# Patient Record
Sex: Female | Born: 1960 | Race: White | Hispanic: No | Marital: Married | Smoking: Current every day smoker
Health system: Southern US, Community
[De-identification: ages and names within clinical notes are randomized; demographics above are authoritative.]

## PROBLEM LIST (undated history)

## (undated) DIAGNOSIS — I10 Essential (primary) hypertension: Secondary | ICD-10-CM

## (undated) DIAGNOSIS — E7800 Pure hypercholesterolemia, unspecified: Secondary | ICD-10-CM

## (undated) HISTORY — DX: Pure hypercholesterolemia, unspecified: E78.00

## (undated) HISTORY — DX: Essential (primary) hypertension: I10

## (undated) HISTORY — PX: ABDOMINAL HYSTERECTOMY: SHX81

---

## 2012-07-07 ENCOUNTER — Ambulatory Visit: Payer: Self-pay | Admitting: Family Medicine

## 2012-07-25 ENCOUNTER — Ambulatory Visit: Payer: Self-pay | Admitting: Obstetrics & Gynecology

## 2012-07-25 LAB — CBC
HCT: 39 % (ref 35.0–47.0)
MCH: 30.2 pg (ref 26.0–34.0)
MCHC: 35.3 g/dL (ref 32.0–36.0)
RDW: 13.4 % (ref 11.5–14.5)

## 2012-08-03 ENCOUNTER — Inpatient Hospital Stay: Payer: Self-pay | Admitting: Obstetrics & Gynecology

## 2012-08-04 LAB — PATHOLOGY REPORT

## 2012-08-04 LAB — HEMOGLOBIN: HGB: 11.4 g/dL — ABNORMAL LOW (ref 12.0–16.0)

## 2015-04-01 NOTE — Op Note (Signed)
PATIENT NAME:  Rebecca Lara, Rebecca Lara MR#:  [id number] DATE OF BIRTH:  11-Jan-1961  DATE OF PROCEDURE:  08/03/2012  PREOPERATIVE DIAGNOSIS: Fibroid uterus.  POSTOPERATIVE DIAGNOSIS: Fibroid uterus.  PROCEDURE: Total abdominal hysterectomy, bilateral salpingo-oophorectomy.   SURGEON: Dierdre Searles. Paul Harris, MD  ASSISTANT: Adria Devonarrie Klett, MD  ANESTHESIA: General.   ESTIMATED BLOOD LOSS: 150 mL.   COMPLICATIONS: None.   FINDINGS: Large fibroid uterus, small normal appearing ovaries.   DISPOSITION: To recovery in stable condition.   TECHNIQUE: The patient is prepped and draped in the usual sterile fashion after adequate anesthesia is obtained in the supine position on the operating room table with a Foley catheter in place. A scalpel is used to create a midline vertical skin incision from just above the pubic symphysis to 3 cm above the umbilicus. Dissection is carried down to the level of the rectus fascia which is incised and dissected in a superior and inferior fashion in the midline and the rectus abdominous muscles are identified and separated in the midline. Peritoneum is penetrated and a retractor is placed after the uterus is delivered through the incision. The uterus is quite large and thus makes for a tedious and high degree of difficulty hysterectomy.   The round ligaments are carefully clamped, transected, and suture ligated with Vicryl sutures. The infundibulopelvic blood vessels and ligaments are carefully clamped, transected, and suture ligated to completely remove the adnexa along with the uterus. Clamps are placed sequentially down to the level of the uterine arteries. The bladder is dissected across the anterior portion of the lower uterine segment and retracted in an inferior fashion so that it is not involved in the surgery. The uterine arteries are clamped, transected, and suture ligated. At this time, the majority of the uterus is amputated due to its size for better visualization. The  remaining portion of the cervix is then clamped with a double-tooth tenaculum. Clamps are placed along the level of the cervix until the level of the external cervical os is reached. Clamps are placed across the vaginal apex and the cervix is completely amputated. The vaginal cuff is closed with interrupted Vicryl sutures. Irrigation of the pelvic cavity is performed with aspiration of all fluid. Excellent hemostasis is noted. The vaginal cuff is appropriately closed with no gapping. The ureters are palpated on both sides and there is no apparent injury to ureter, bowel, or bladder. The patient is leveled and any packing sponges and retractors are removed. All sponge counts are correct at this point in time. The rectus fascia is closed with a #1 PDS suture. The subcutaneous tissues are irrigated and hemostasis is assured using electrocautery. The umbilicus is plicated back into a normal anatomical position. A subcutaneous layer of plain gut suture is placed followed by surgical clips or staples to the skin. A bandage is then applied. The patient goes to the recovery room in stable condition. All sponge, instrument, and needle counts are correct at the conclusion of the case.  ____________________________ R. Annamarie MajorPaul Harris, MD rph:slb D: 08/03/2012 12:58:59 ET T: 08/03/2012 13:26:46 ET JOB#: [id number]  cc: Dierdre Searles. Paul Harris, MD, <Dictator> Nadara MustardOBERT P HARRIS MD ELECTRONICALLY SIGNED 08/04/2012 9:41

## 2015-09-13 ENCOUNTER — Emergency Department
Admission: EM | Admit: 2015-09-13 | Discharge: 2015-09-13 | Disposition: A | Discharge status: Home / Self Care | Payer: 59 | Attending: Emergency Medicine | Admitting: Emergency Medicine

## 2015-09-13 ENCOUNTER — Emergency Department: Payer: 59

## 2015-09-13 ENCOUNTER — Other Ambulatory Visit: Payer: Self-pay

## 2015-09-13 ENCOUNTER — Encounter: Payer: Self-pay | Admitting: Emergency Medicine

## 2015-09-13 DIAGNOSIS — J209 Acute bronchitis, unspecified: Secondary | ICD-10-CM | POA: Insufficient documentation

## 2015-09-13 DIAGNOSIS — Z720 Tobacco use: Secondary | ICD-10-CM | POA: Diagnosis not present

## 2015-09-13 DIAGNOSIS — Z79899 Other long term (current) drug therapy: Secondary | ICD-10-CM | POA: Diagnosis not present

## 2015-09-13 DIAGNOSIS — R05 Cough: Secondary | ICD-10-CM | POA: Diagnosis present

## 2015-09-13 DIAGNOSIS — R000 Tachycardia, unspecified: Secondary | ICD-10-CM | POA: Insufficient documentation

## 2015-09-13 MED ORDER — PREDNISONE 20 MG PO TABS: 60.0000 mg | ORAL_TABLET | Freq: Every day | ORAL | Status: AC

## 2015-09-13 MED ORDER — ALBUTEROL (5 MG/ML) CONTINUOUS INHALATION SOLN: 5.0000 mg/h | INHALATION_SOLUTION | Freq: Once | RESPIRATORY_TRACT | Status: DC

## 2015-09-13 MED ORDER — ALBUTEROL SULFATE (2.5 MG/3ML) 0.083% IN NEBU
2.5000 mg | INHALATION_SOLUTION | Freq: Once | RESPIRATORY_TRACT | Status: AC
  Administered 2015-09-13: 2.5 mg via RESPIRATORY_TRACT
  Filled 2015-09-13: qty 3

## 2015-09-13 MED ORDER — AZITHROMYCIN 250 MG PO TABS
500.0000 mg | ORAL_TABLET | Freq: Once | ORAL | Status: AC
  Administered 2015-09-13: 500 mg via ORAL
  Filled 2015-09-13: qty 2

## 2015-09-13 MED ORDER — SODIUM CHLORIDE 0.9 % IV BOLUS (SEPSIS)
1000.0000 mL | Freq: Once | INTRAVENOUS | Status: AC
  Administered 2015-09-13: 1000 mL via INTRAVENOUS
  Filled 2015-09-13: qty 1000

## 2015-09-13 MED ORDER — AZITHROMYCIN 250 MG PO TABS: ORAL_TABLET | ORAL | Status: AC

## 2015-09-13 MED ORDER — ALBUTEROL SULFATE HFA 108 (90 BASE) MCG/ACT IN AERS: 2.0000 | INHALATION_SPRAY | Freq: Four times a day (QID) | RESPIRATORY_TRACT | Status: DC | PRN

## 2015-09-13 MED ORDER — IPRATROPIUM-ALBUTEROL 0.5-2.5 (3) MG/3ML IN SOLN
3.0000 mL | Freq: Once | RESPIRATORY_TRACT | Status: AC
  Administered 2015-09-13: 3 mL via RESPIRATORY_TRACT

## 2015-09-13 MED ORDER — PREDNISONE 20 MG PO TABS
60.0000 mg | ORAL_TABLET | Freq: Once | ORAL | Status: AC
  Administered 2015-09-13: 60 mg via ORAL
  Filled 2015-09-13: qty 3

## 2015-09-13 NOTE — ED Notes (Signed)
Pt arrived via EMS. Pt given 1 duoneb en route. Pt states she began having SOB after taking Dayquil for a cold, pt states the cold settled in her chest. Pt states she has been coughing up thin, yellow phlegm since Thursday. Pt states she began getting SOB, called EMS due to not being able to breathe. Increased work of breathing noted at this time, increased RR, pt able to speak in full sentences. Congested cough noted at this time.

## 2015-09-13 NOTE — Discharge Instructions (Signed)

## 2015-09-13 NOTE — ED Provider Notes (Addendum)
[facility] Emergency Department Provider Note  ____________________________________________   I have reviewed the triage vital signs and the nursing notes.   HISTORY  Chief Complaint Cough and Shortness of Breath    HPI Rebecca Lara is a 54 y.o. female with a history of tobacco abuse otherwise healthy states she gets sinusitis once a year. Started with a runny nose and yellowish drainage on Tuesday, on Wednesday she began to cough worse and her cough has persisted. Her URI symptoms have otherwise abated with the cough is persistent. She also began to wheeze. She smokes 8 cigarettes a day. Any fever, she denies any nausea or vomiting or rashes no orthopnea and no leg swelling no chest pain. She does not have a history of recurrent bronchitis she states. For triage, she was wheezy and she continues to wheeze. She did receive nebulizer prior to arrival, she states it is helped her feel much better  History reviewed. No pertinent past medical history.  There are no active problems to display for this patient.   Past Surgical History  Procedure Laterality Date  . Abdominal hysterectomy      Current Outpatient Rx  Name  Route  Sig  Dispense  Refill  . Misc Natural Products (ESTROVEN ENERGY) TABS   Oral   Take by mouth.           Allergies Review of patient's allergies indicates no known allergies.  History reviewed. No pertinent family history. no history of blood clots in her family. No history early heart disease Social History Social History  Substance Use Topics  . Smoking status: Current Every Day Smoker -- 0.50 packs/day    Types: Cigarettes  . Smokeless tobacco: Never Used  . Alcohol Use: No    Review of Systems Constitutional: No fever/chills Eyes: No visual changes. ENT: No sore throat. No stiff neck no neck pain Cardiovascular: Denies chest pain. Respiratory: Denies shortness of breath. Gastrointestinal:   no vomiting.  No  diarrhea.  No constipation. Genitourinary: Negative for dysuria. Musculoskeletal: Negative lower extremity swelling Skin: Negative for rash. Neurological: Negative for headaches, focal weakness or numbness. 10-point ROS otherwise negative.  ____________________________________________   PHYSICAL EXAM:  VITAL SIGNS: ED Triage Vitals  Enc Vitals Group     BP 09/13/15 0710 156/105 mmHg     Pulse Rate 09/13/15 0710 129     Resp 09/13/15 0710 24     Temp 09/13/15 0710 97.8 F (36.6 C)     Temp Source 09/13/15 0710 Oral     SpO2 09/13/15 0710 93 %     Weight 09/13/15 0710 180 lb (81.647 kg)     Height 09/13/15 0710  (1.702 m)     Head Cir --      Peak Flow --      Pain Score --      Pain Loc --      Pain Edu? --      Excl. in GC? --     Constitutional: Alert and oriented. Well appearing and in no acute distress. Walking to the room Eyes: Conjunctivae are normal. PERRL. EOMI. Head: Atraumatic. Nose: No congestion/rhinnorhea. Mouth/Throat: Mucous membranes are moist.  Oropharynx non-erythematous. Neck: No stridor.   Nontender with no meningismus Cardiovascular: Tachycardia noted Grossly normal heart sounds.  Good peripheral circulation. Respiratory: Normal respiratory effort.  No retractions. Patient with continued diffuse wheeze but good air movement no significant accessory muscle use speaking in full sentences no rales or rhonchi Gastrointestinal: Soft  and nontender. No distention. No guarding no rebound Back:  There is no focal tenderness or step off there is no midline tenderness there are no lesions noted. there is no CVA tenderness Musculoskeletal: No lower extremity tenderness. No joint effusions, no DVT signs strong distal pulses no edema Neurologic:  Normal speech and language. No gross focal neurologic deficits are appreciated.  Skin:  Skin is warm, dry and intact. No rash noted. Psychiatric: Mood and affect are normal. Speech and behavior are  normal.  ____________________________________________   LABS (all labs ordered are listed, but only abnormal results are displayed)  Labs Reviewed - No data to display ____________________________________________  EKG  I have reviewed the EKG, sinus tach rate 129, normal axis no acute ST elevation or depression ____________________________________________  RADIOLOGY  I have reviewed radiology results ____________________________________________   PROCEDURES  Procedure(s) performed: None  Critical Care performed: None  ____________________________________________   INITIAL IMPRESSION / ASSESSMENT AND PLAN / ED COURSE  Pertinent labs & imaging results that were available during my care of the patient were reviewed by me and considered in my medical decision making (see chart for details).  Patient with cough and wheeze in the context of tobacco abuse. We will give her coverage for atypical infection given her smoking history although the significant history viral. I have counseled her about smoking cessation. We'll give her IV fluid her heart rate is somewhat up I suspect that is more secondary to the patient's albuterol prior to arrival then acute illness, she certainly does not appear toxic. Nonetheless we'll give her IV fluid. I will give her steroids for her wheeze. Nothing at this time to suggest myocarditis endocarditis pericarditis pulmonary embolus, CAD or pneumothorax, chest x-ray is reviewed and is normal. It is my hope that she will feel better soon and be safe for discharge   ----------------------------------------- 9:19 AM on 09/13/2015 -----------------------------------------   The patient states that this time she feels much better. She feels that her breathing is greatly improved by the neb. She still has wheezing but is moving air better and appears quite comfortable. We will continue to watch her. Heart rate is 110 to 120s mostly at this time which is  reflective of the albuterol. ____________________________________________   ----------------------------------------- 10:30 AM on 09/13/2015 -----------------------------------------   Patient has clear lungs states she feels better than she has 4 days, heart rate is 99 sats are 97 no work of breathing feels 100% better we will discharge her home with steroids and antibiotics because of her smoking history as well as albuterol. Return precautions and follow-up stressed and understood. Respiratory rate at this time is 19.  FINAL CLINICAL IMPRESSION(S) / ED DIAGNOSES  Final diagnoses:  None     Jeanmarie Plant, MD 09/13/15 0454  Jeanmarie Plant, MD 09/13/15 0981  Jeanmarie Plant, MD 09/13/15 1030

## 2015-09-17 ENCOUNTER — Encounter: Payer: Self-pay | Admitting: Family

## 2015-09-17 ENCOUNTER — Ambulatory Visit: Payer: Self-pay | Admitting: Family

## 2015-09-17 VITALS — BP 170/110 | HR 90 | Temp 98.6°F | Resp 16

## 2015-09-17 DIAGNOSIS — R059 Cough, unspecified: Secondary | ICD-10-CM

## 2015-09-17 DIAGNOSIS — R030 Elevated blood-pressure reading, without diagnosis of hypertension: Secondary | ICD-10-CM

## 2015-09-17 DIAGNOSIS — IMO0001 Reserved for inherently not codable concepts without codable children: Secondary | ICD-10-CM

## 2015-09-17 DIAGNOSIS — R05 Cough: Secondary | ICD-10-CM

## 2015-09-17 DIAGNOSIS — J019 Acute sinusitis, unspecified: Secondary | ICD-10-CM

## 2015-09-17 MED ORDER — BENZONATATE 200 MG PO CAPS: 200.0000 mg | ORAL_CAPSULE | Freq: Two times a day (BID) | ORAL | Status: DC | PRN

## 2015-09-17 MED ORDER — LEVOFLOXACIN 500 MG PO TABS: 500.0000 mg | ORAL_TABLET | Freq: Every day | ORAL | Status: DC

## 2015-09-17 NOTE — Progress Notes (Signed)
S/ persistant cough despite rx in ED 4 days ago  . Facial pain and pressure , fever blisters.increased PND  Can not rest   O/ alert , NAD , dry cough , tearful ,stressed  ENT nasal mucosa excoriated + facial tenderness and increased PND  Heart RSR Lungs decreased breath sounds  A/ sinusitis  Cough  Elevated bp - stressed,  Smoked and drank large tea prior to arrival  no hx   P /Rx levaquin 500 mg one daily x 10  Hydration . Decrease caffeine .  Rest at home this evening. F/u with PCP tomorrow for bp recheck .

## 2015-09-18 ENCOUNTER — Telehealth: Payer: Self-pay | Admitting: Family

## 2015-09-18 NOTE — Telephone Encounter (Signed)
Called to follow up on clinical status . States she will get her bp checked at the drug store across the street from her work today. She has no history of bp elevation except in the ED after  Neb tx. Multiple complaints about her visit. Was late being called back .  She was disturbed that when getting her rx filled here  another patient  seen at the same time by another provider got her rx sooner ,seemingly because it was hand written vs e-prescribed. I did have to resend the rx  Electronically as she was using a different pharmacy than was on file.  I expressed understanding of her concerns and that I hoped she would soon feel improvement with her medication.She did express thanks for what we did do for her and during her visit she had expressed numerous times thanks for seeing her .

## 2015-09-23 ENCOUNTER — Ambulatory Visit: Payer: Self-pay | Admitting: Unknown Physician Specialty

## 2016-04-20 IMAGING — CR DG CHEST 2V
1 series · 2 of 2 positions shown · non-contrast
Comparison: None.

CLINICAL DATA: Two day history of cough.  Shortness of breath.

EXAM:
CHEST  2 VIEW

[Series 1: w chest pa · 0.14mm/px · 2 of 2 slices shown]
[im 1/2]
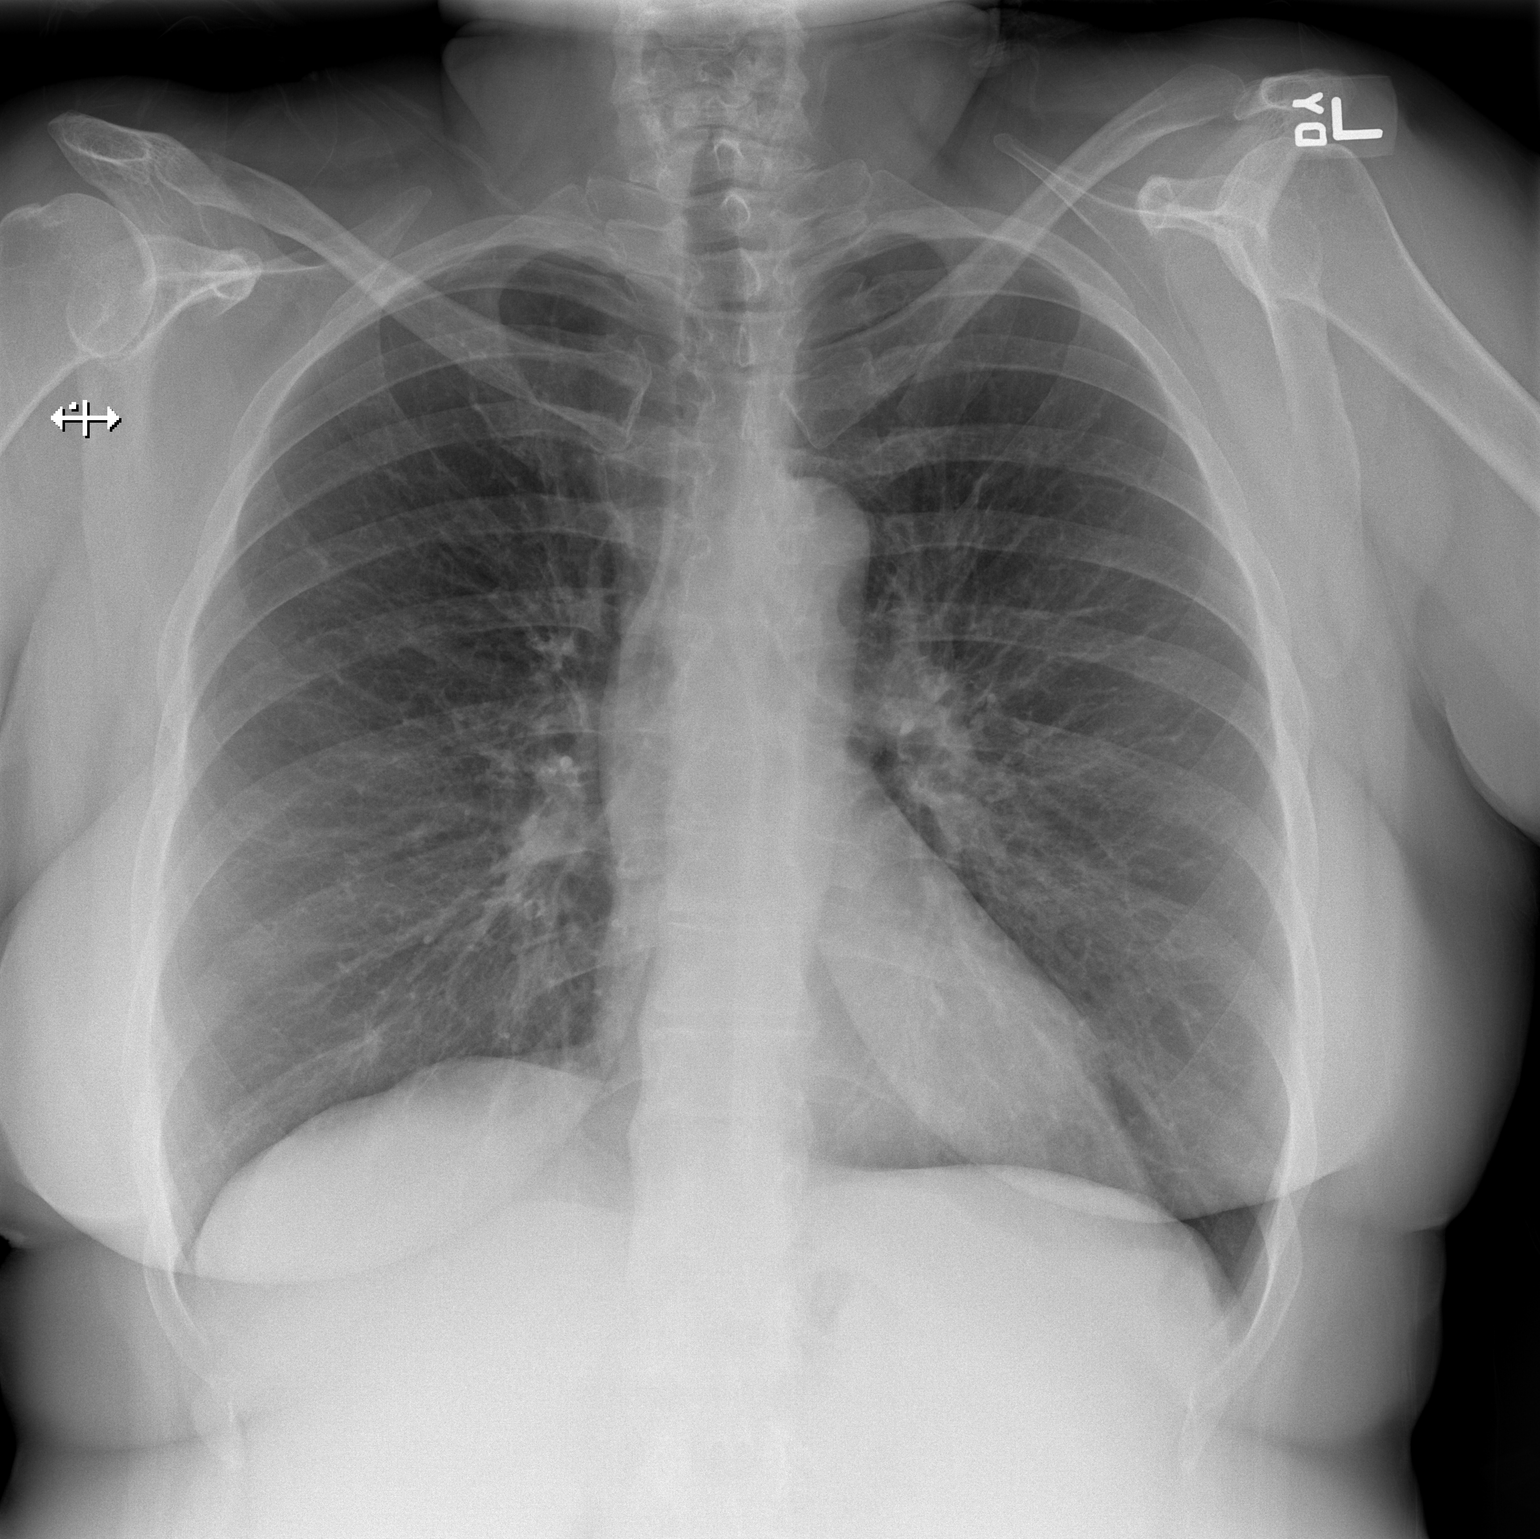
[im 2/2]
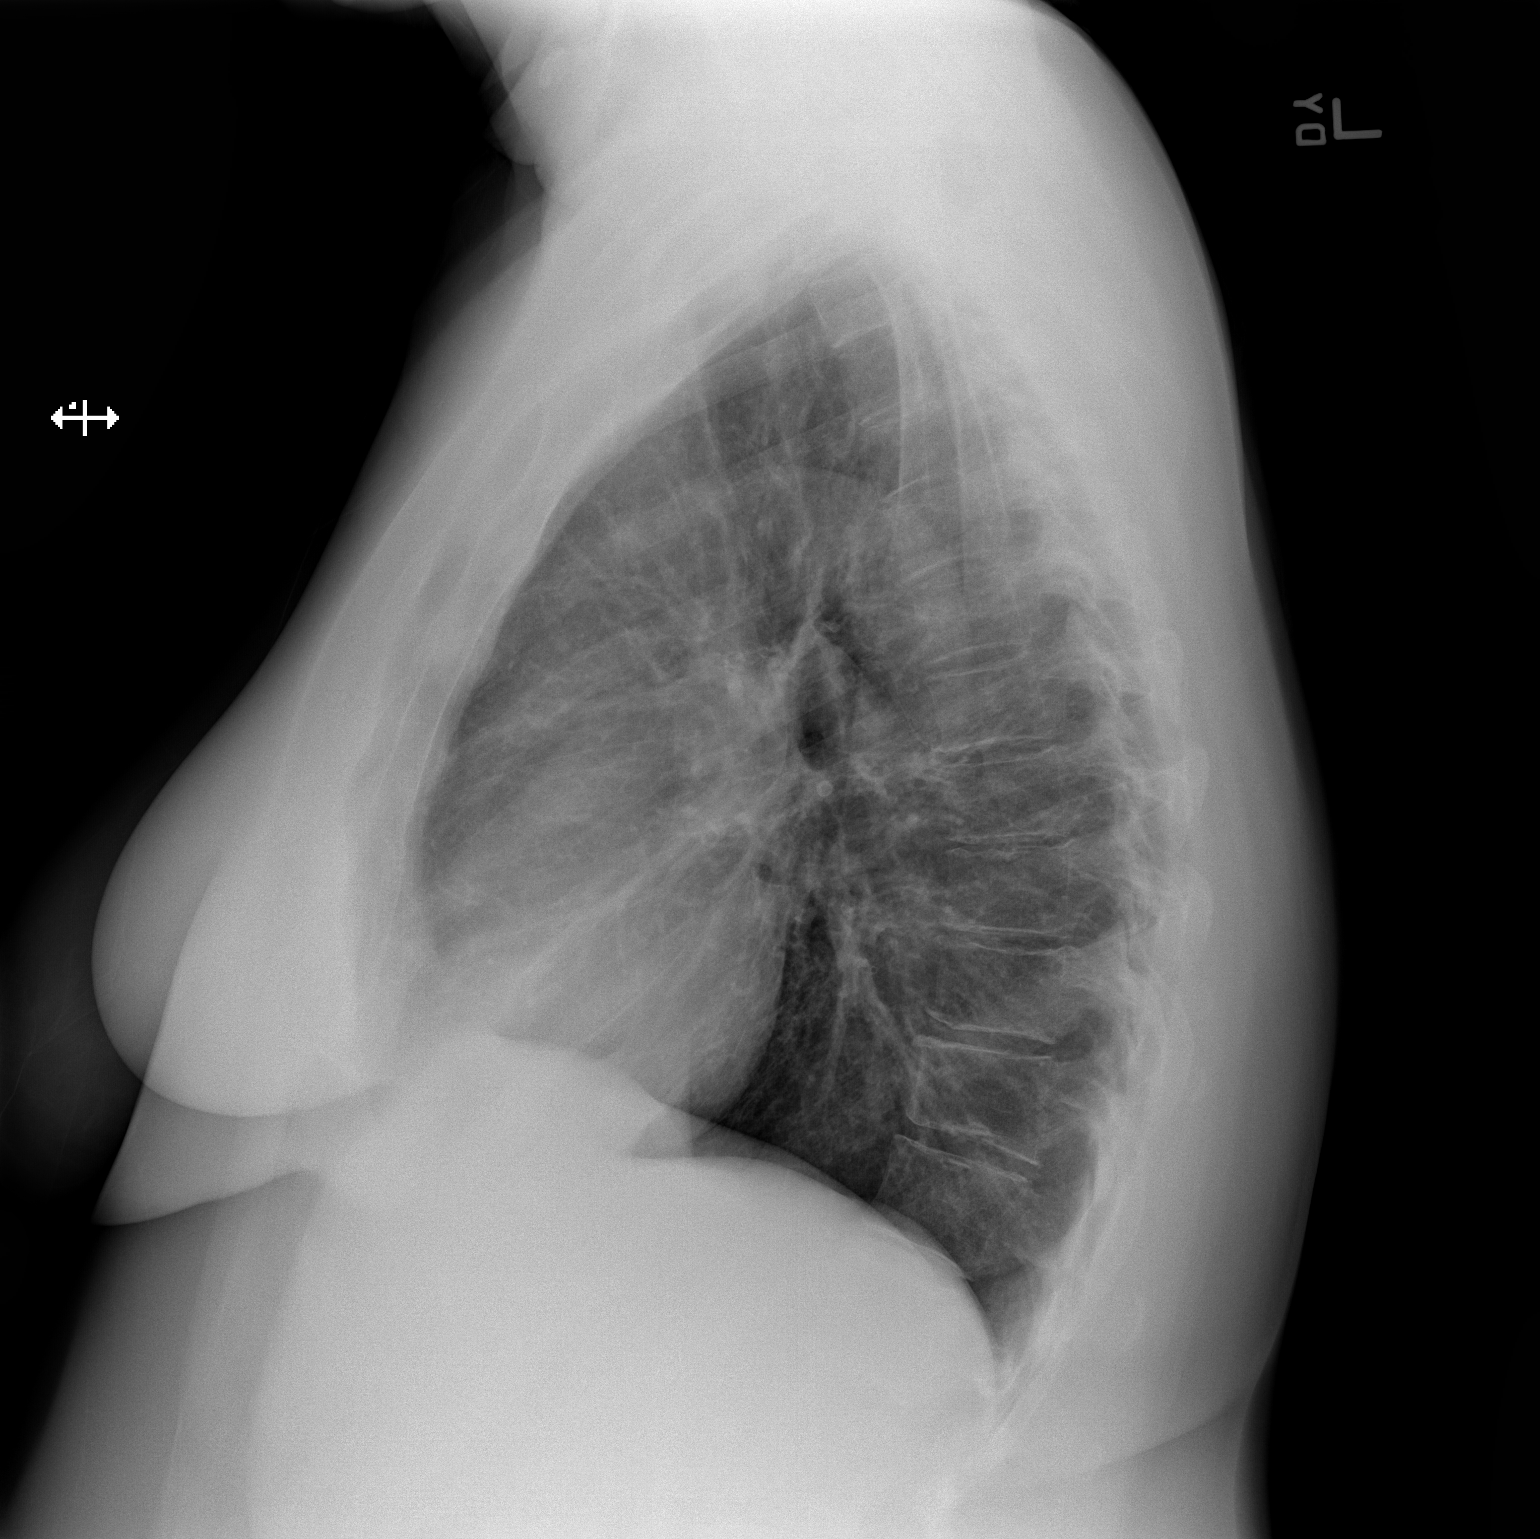

[2 of 2 positions shown; findings below may reference images not displayed]

FINDINGS: There is no edema or consolidation. The heart size and pulmonary
vascularity are normal. No adenopathy. No bone lesions.
IMPRESSION: No edema or consolidation.

## 2016-07-09 DIAGNOSIS — H6500 Acute serous otitis media, unspecified ear: Secondary | ICD-10-CM | POA: Diagnosis not present

## 2024-02-27 ENCOUNTER — Encounter: Payer: Self-pay | Admitting: Emergency Medicine

## 2024-02-27 ENCOUNTER — Ambulatory Visit
Admission: EM | Admit: 2024-02-27 | Discharge: 2024-02-27 | Disposition: A | Discharge status: Home / Self Care | Payer: PRIVATE HEALTH INSURANCE | Attending: Family Medicine | Admitting: Family Medicine

## 2024-02-27 DIAGNOSIS — J0100 Acute maxillary sinusitis, unspecified: Secondary | ICD-10-CM

## 2024-02-27 DIAGNOSIS — F17210 Nicotine dependence, cigarettes, uncomplicated: Secondary | ICD-10-CM | POA: Diagnosis not present

## 2024-02-27 DIAGNOSIS — J209 Acute bronchitis, unspecified: Secondary | ICD-10-CM | POA: Diagnosis not present

## 2024-02-27 MED ORDER — PREDNISONE 10 MG (21) PO TBPK: ORAL_TABLET | Freq: Every day | ORAL | 0 refills | Status: DC

## 2024-02-27 MED ORDER — AMOXICILLIN-POT CLAVULANATE 875-125 MG PO TABS: 1.0000 | ORAL_TABLET | Freq: Two times a day (BID) | ORAL | 0 refills | Status: DC

## 2024-02-27 NOTE — ED Provider Notes (Signed)
 [facility]    CSN: [id number] Arrival date & time: 02/27/24  1236      History   Chief Complaint Chief Complaint  Patient presents with   sinus pressure    Eye Drainage   Nasal Congestion    HPI Rebecca Lara is a 63 y.o. female.   HPI  History obtained from the patient. Rebecca Lara presents for concern for sinus infection.  She has nasal congestion with green sputum and facial pain. Has left ear congestion.  Symptoms started 2 weeks ago.  She has been taking AlkaSeltzer.  Has slight cough and shortness of breath of breathe with walking.  She smokes.     History reviewed. No pertinent past medical history.  There are no active problems to display for this patient.   Past Surgical History:  Procedure Laterality Date   ABDOMINAL HYSTERECTOMY      OB History   No obstetric history on file.      Home Medications    Prior to Admission medications   Medication Sig Start Date End Date Taking? Authorizing Provider  amoxicillin-clavulanate (AUGMENTIN) 875-125 MG tablet Take 1 tablet by mouth every 12 (twelve) hours. 02/27/24  Yes Rapheal Masso, DO  predniSONE (STERAPRED UNI-PAK 21 TAB) 10 MG (21) TBPK tablet Take by mouth daily. Take 6 tabs by mouth daily for 1, then 5 tabs for 1 day, then 4 tabs for 1 day, then 3 tabs for 1 day, then 2 tabs for 1 day, then 1 tab for 1 day. 02/27/24  Yes Angelo Prindle, DO  albuterol (PROVENTIL HFA;VENTOLIN HFA) 108 (90 BASE) MCG/ACT inhaler Inhale 2 puffs into the lungs every 6 (six) hours as needed for wheezing or shortness of breath. 09/13/15   Jeanmarie Plant, MD  benzonatate (TESSALON) 200 MG capsule Take 1 capsule (200 mg total) by mouth 2 (two) times daily as needed for cough. 09/17/15   Francesco Sor, NP  levofloxacin (LEVAQUIN) 500 MG tablet Take 1 tablet (500 mg total) by mouth daily. 09/17/15   Francesco Sor, NP  Misc Natural Products (ESTROVEN ENERGY) TABS Take 1 tablet by mouth daily.     [provider]  Multiple Vitamins-Minerals (MULTIVITAMIN ADULT PO) Take 1 tablet by mouth daily.    [provider]  Phenylephrine-Aspirin (ALKA-SELTZER PLUS SINUS PO) Take 1 capsule by mouth daily.    [provider]    Family History No family history on file.  Social History Social History   Tobacco Use   Smoking status: Every Day    Current packs/day: 0.50    Types: Cigarettes   Smokeless tobacco: Never  Vaping Use   Vaping status: Never Used  Substance Use Topics   Alcohol use: No    Alcohol/week: 0.0 standard drinks of alcohol   Drug use: No     Allergies   Biaxin [clarithromycin]   Review of Systems Review of Systems: negative unless otherwise stated in HPI.      Physical Exam Triage Vital Signs ED Triage Vitals  Encounter Vitals Group     BP      Systolic BP Percentile      Diastolic BP Percentile      Pulse      Resp      Temp      Temp src      SpO2      Weight      Height      Head Circumference  Peak Flow      Pain Score      Pain Loc      Pain Education      Exclude from Growth Chart    No data found.  Updated Vital Signs BP (!) 163/89 (BP Location: Left Arm)   Pulse 100   Temp 98.2 F (36.8 C) (Oral)   Resp 18   SpO2 95%   Visual Acuity Right Eye Distance:   Left Eye Distance:   Bilateral Distance:    Right Eye Near:   Left Eye Near:    Bilateral Near:     Physical Exam GEN:     alert, non-toxic appearing female in no distress    HENT:  mucus membranes moist, oropharyngeal without lesions or erythema, no tonsillar hypertrophy or exudates, thick yellow-green nasal discharge, right TM normal, left TM effusion, no frontal or ethmoid sinus tenderness, + bilateral maxillary sinus tenderness EYES:   no scleral injection or discharge NECK:  normal ROM, +lymphadenopathy, no meningismus   RESP:  no increased work of breathing, rhonchi bilaterally CVS:   regular rate and rhythm Skin:   warm and dry, no rash on  visible skin    UC Treatments / Results  Labs (all labs ordered are listed, but only abnormal results are displayed) Labs Reviewed - No data to display  EKG   Radiology No results found.  Procedures Procedures (including critical care time)  Medications Ordered in UC Medications - No data to display  Initial Impression / Assessment and Plan / UC Course  I have reviewed the triage vital signs and the nursing notes.  Pertinent labs & imaging results that were available during my care of the patient were reviewed by me and considered in my medical decision making (see chart for details).       Pt is a 63 y.o. female who presents for 2 weeks of nasal congestion and cough that is not improving.  Rebecca Lara is  afebrile here without recent antipyretics. Satting adequately on room air.  She is hypertensive today at 163/89.    Overall pt is  non-toxic appearing, well hydrated, without respiratory distress. Pulmonary exam is remarkable for rhonchi and productive cough.  Pt declines chest xray and states she will return if symptoms do not improve.   COVID  and influenza testing deferred due to length of symptoms.   Treat acute bronchitis and sinusitis with steroids and antibiotics as below.    Typical duration of symptoms discussed. Return and ED precautions given and patient voiced understanding.   Discussed MDM, treatment plan and plan for follow-up with patient who agrees with plan.      Final Clinical Impressions(s) / UC Diagnoses   Final diagnoses:  Acute bronchitis, unspecified organism  Acute maxillary sinusitis, recurrence not specified  Cigarette smoker     Discharge Instructions      Stop by the pharmacy to pick up your prescriptions.  Follow up with your primary care provider or return to the urgent care, if not improving.       ED Prescriptions     Medication Sig Dispense Auth. Provider   amoxicillin-clavulanate (AUGMENTIN) 875-125 MG tablet Take 1 tablet  by mouth every 12 (twelve) hours. 20 tablet Zandon Talton, DO   predniSONE (STERAPRED UNI-PAK 21 TAB) 10 MG (21) TBPK tablet Take by mouth daily. Take 6 tabs by mouth daily for 1, then 5 tabs for 1 day, then 4 tabs for 1 day, then 3 tabs for 1 day,  then 2 tabs for 1 day, then 1 tab for 1 day. 21 tablet Katha Cabal, DO      PDMP not reviewed this encounter.   Katha Cabal, DO 02/27/24 1423

## 2024-02-27 NOTE — Discharge Instructions (Addendum)
 Stop by the pharmacy to pick up your prescriptions.  Follow up with your primary care provider or return to the urgent care, if not improving.

## 2024-02-27 NOTE — ED Triage Notes (Signed)
 Pt presents with sinus pressure, nasal drainage, and eye drainage for a couple of weeks. Pt reports taking OTC medication for her symptoms.

## 2024-07-03 ENCOUNTER — Ambulatory Visit
Admission: EM | Admit: 2024-07-03 | Discharge: 2024-07-03 | Disposition: A | Discharge status: Home / Self Care | Attending: Emergency Medicine | Admitting: Emergency Medicine

## 2024-07-03 ENCOUNTER — Ambulatory Visit (INDEPENDENT_AMBULATORY_CARE_PROVIDER_SITE_OTHER)

## 2024-07-03 DIAGNOSIS — J9801 Acute bronchospasm: Secondary | ICD-10-CM

## 2024-07-03 DIAGNOSIS — J0140 Acute pansinusitis, unspecified: Secondary | ICD-10-CM | POA: Diagnosis not present

## 2024-07-03 DIAGNOSIS — R051 Acute cough: Secondary | ICD-10-CM

## 2024-07-03 DIAGNOSIS — H04121 Dry eye syndrome of right lacrimal gland: Secondary | ICD-10-CM | POA: Diagnosis not present

## 2024-07-03 MED ORDER — OLOPATADINE HCL 0.2 % OP SOLN: 1.0000 [drp] | Freq: Every day | OPHTHALMIC | 0 refills | Status: DC

## 2024-07-03 MED ORDER — ALBUTEROL SULFATE HFA 108 (90 BASE) MCG/ACT IN AERS: 1.0000 | INHALATION_SPRAY | RESPIRATORY_TRACT | 0 refills | Status: DC | PRN

## 2024-07-03 MED ORDER — AMOXICILLIN-POT CLAVULANATE 875-125 MG PO TABS: 1.0000 | ORAL_TABLET | Freq: Two times a day (BID) | ORAL | 0 refills | Status: DC

## 2024-07-03 MED ORDER — AEROCHAMBER MV MISC: 1 refills | Status: DC

## 2024-07-03 MED ORDER — FLUTICASONE PROPIONATE 50 MCG/ACT NA SUSP: 2.0000 | Freq: Every day | NASAL | 0 refills | Status: DC

## 2024-07-03 MED ORDER — PREDNISONE 20 MG PO TABS: 40.0000 mg | ORAL_TABLET | Freq: Every day | ORAL | 0 refills | Status: AC

## 2024-07-03 NOTE — ED Triage Notes (Signed)
 Pt c/o sinus pressure,chest congestion & sob x2 wks. Also c/o R eye drainage off/on since Jan. Hx of sinus infections. No OTC meds.

## 2024-07-03 NOTE — ED Provider Notes (Signed)
 HPI  SUBJECTIVE:  Rebecca Lara is a 63 y.o. female who presents with 2 issues: First, she reports 2 to 3 weeks of headache, sinus pain and pressure, nasal congestion, chest congestion and occasional loose cough productive of green sputum.  Left ear pain, decreased hearing.  Some dyspnea on exertion with heavy exertion.  No wheezing, shortness of breath at last, chest pain, fevers.  She is able to sleep at night without waking up coughing.  No facial swelling, upper dental pain, rhinorrhea.  She has tried Alka-Seltzer without improvement in her symptoms.  No aggravating factors.  No antibiotics in the past 3 months.  Second, she reports increased tearing from her right eye since January.  It has not changed recently.  No eye itching, burning, visual changes, foreign body sensation.  She has not tried anything for symptoms.  No aggravating or alleviating factors.  She has a past medical history of recurrent otitis media and remote history of frequent sinus problems.  No history of COPD/asthma, although she is a smoker.  PCP: [facility]   History reviewed. No pertinent past medical history.  Past Surgical History:  Procedure Laterality Date   ABDOMINAL HYSTERECTOMY      History reviewed. No pertinent family history.  Social History   Tobacco Use   Smoking status: Every Day    Current packs/day: 0.50    Types: Cigarettes   Smokeless tobacco: Never  Vaping Use   Vaping status: Never Used  Substance Use Topics   Alcohol use: No    Alcohol/week: 0.0 standard drinks of alcohol   Drug use: No    No current facility-administered medications for this encounter.  Current Outpatient Medications:    albuterol  (VENTOLIN  HFA) 108 (90 Base) MCG/ACT inhaler, Inhale 1-2 puffs into the lungs every 4 (four) hours as needed for wheezing or shortness of breath., Disp: 1 each, Rfl: 0   amoxicillin -clavulanate (AUGMENTIN ) 875-125 MG tablet, Take 1 tablet by mouth every 12 (twelve)  hours., Disp: 14 tablet, Rfl: 0   fluticasone  (FLONASE ) 50 MCG/ACT nasal spray, Place 2 sprays into both nostrils daily., Disp: 16 g, Rfl: 0   predniSONE  (DELTASONE ) 20 MG tablet, Take 2 tablets (40 mg total) by mouth daily with breakfast for 5 days., Disp: 10 tablet, Rfl: 0   Spacer/Aero-Holding Chambers (AEROCHAMBER MV) inhaler, Use as instructed, Disp: 1 each, Rfl: 1   Misc Natural Products (ESTROVEN ENERGY) TABS, Take 1 tablet by mouth daily. , Disp: , Rfl:    Multiple Vitamins-Minerals (MULTIVITAMIN ADULT PO), Take 1 tablet by mouth daily., Disp: , Rfl:    Phenylephrine-Aspirin (ALKA-SELTZER PLUS SINUS PO), Take 1 capsule by mouth daily., Disp: , Rfl:   Allergies  Allergen Reactions   Biaxin [Clarithromycin] Nausea And Vomiting     ROS  As noted in HPI.   Physical Exam  BP (!) 158/72 (BP Location: Left Arm)   Pulse 98   Temp 99 F (37.2 C) (Oral)   Resp 16   Ht 5' 7 (1.702 m)   Wt 81.6 kg   SpO2 96%   BMI 28.19 kg/m   Constitutional: Well developed, well nourished, no acute distress Eyes: PERRL, EOMI, mild right conjunctival injection medially.  No direct or consensual photophobia.  No periorbital erythema, edema, purulent drainage. HENT: Normocephalic, atraumatic,mucus membranes moist.  TMs normal laterally.  Decreased hearing left ear compared to right.  No tenderness, crepitus over the TMJ.  No pain with traction on pinna, palpation of tragus left ear.  Positive purulent nasal congestion.  Normal turbinates.  No maxillary, frontal sinus tenderness. Neck: Positive shotty cervical lymphadenopathy Respiratory: Poor to fair air movement, clear to auscultation bilaterally, no rales, no wheezing, no rhonchi.  No anterior, lateral chest wall tenderness Cardiovascular: Normal rate and rhythm, no murmurs, no gallops, no rubs GI: nondistended skin: No rash, skin intact Musculoskeletal: no deformities Neurologic: Alert & oriented x 3, CN III-XII grossly intact, no motor  deficits, sensation grossly intact Psychiatric: Speech and behavior appropriate   ED Course   Medications - No data to display  Orders Placed This Encounter  Procedures   DG Chest 2 View    Standing Status:   Standing    Number of Occurrences:   1    Reason for Exam (SYMPTOM  OR DIAGNOSIS REQUIRED):   sob x2 wks.   No results found for this or any previous visit (from the past 24 hours). DG Chest 2 View Result Date: 07/03/2024 CLINICAL DATA:  Shortness of breath. EXAM: CHEST - 2 VIEW COMPARISON:  Chest radiograph dated 09/13/2015. FINDINGS: No focal consolidation, pleural effusion, or pneumothorax. The cardiac silhouette is within normal limits. No acute osseous pathology. IMPRESSION: No active cardiopulmonary disease. Electronically Signed   By: Vanetta Chou M.D.   On: 07/03/2024 11:06    ED Clinical Impression  1. Acute non-recurrent pansinusitis   2. Acute cough   3. Bronchospasm      ED Assessment/Plan     Reviewed imaging independently.  Normal chest x-ray.  See radiology report for details.  1.  Acute pansinusitis.  Patient qualifies for antibiotics due to duration of symptoms.  Will send home with Augmentin  for 7 days, Flonase , saline nasal irrigation.  Patient states prednisone  gives her a red face, but is willing to try it as I think it would be very helpful.  Prednisone  40 mg for 5 days.  States that she cannot tolerate Mucinex as it causes a dry cough.  She states that she will follow-up with her PCP at [facility] but would like to establish care at with an ENT.  Will provide information for [facility].  2.  Cough, shortness of breath.  Chest x-ray normal.  Discussed with patient while in department.  Suspect component of COPD with her history of smoking.  Will send home with an albuterol  inhaler with a spacer every 4-6 hours, 40 mg of prednisone  for 5 days.  Follow-up with PCP.  3.  Increased tearing right eye for 7 months.  Suspect dry eye  versus allergies.  Systane lubricating eyedrops and Pataday .  Discussed  imaging, MDM, treatment plan, and plan for follow-up with patient . patient agrees with plan.   Meds ordered this encounter  Medications   fluticasone  (FLONASE ) 50 MCG/ACT nasal spray    Sig: Place 2 sprays into both nostrils daily.    Dispense:  16 g    Refill:  0   amoxicillin -clavulanate (AUGMENTIN ) 875-125 MG tablet    Sig: Take 1 tablet by mouth every 12 (twelve) hours.    Dispense:  14 tablet    Refill:  0   predniSONE  (DELTASONE ) 20 MG tablet    Sig: Take 2 tablets (40 mg total) by mouth daily with breakfast for 5 days.    Dispense:  10 tablet    Refill:  0   albuterol  (VENTOLIN  HFA) 108 (90 Base) MCG/ACT inhaler    Sig: Inhale 1-2 puffs into the lungs every 4 (four) hours as needed for wheezing or  shortness of breath.    Dispense:  1 each    Refill:  0   Spacer/Aero-Holding Chambers (AEROCHAMBER MV) inhaler    Sig: Use as instructed    Dispense:  1 each    Refill:  1      *This clinic note was created using Dragon dictation software. Therefore, there may be occasional mistakes despite careful proofreading. ?    Van Knee, MD 07/03/24 1347

## 2024-07-03 NOTE — Discharge Instructions (Addendum)
 Finish the Augmentin , even if you feel better.  Flonase  will help with inflammation.  Saline nasal irrigation with a NeilMed sinus rinse and distilled water as often as you want to wash the infection out.  Prednisone  will help with pain and swelling both.  2 puffs from your albuterol  inhaler using your spacer every 4-6 hours as needed for coughing, shortness of breath.  2 puffs 10 minutes before you go outside or exercise.  Cool compresses to the eye, Systane lubricating eyedrops as often as you want.  Pataday  will help with any possible allergy symptoms of the eye.

## 2024-07-17 ENCOUNTER — Emergency Department

## 2024-07-17 ENCOUNTER — Other Ambulatory Visit: Payer: Self-pay

## 2024-07-17 DIAGNOSIS — J069 Acute upper respiratory infection, unspecified: Secondary | ICD-10-CM | POA: Diagnosis not present

## 2024-07-17 DIAGNOSIS — Z7951 Long term (current) use of inhaled steroids: Secondary | ICD-10-CM | POA: Insufficient documentation

## 2024-07-17 DIAGNOSIS — R0602 Shortness of breath: Secondary | ICD-10-CM | POA: Diagnosis present

## 2024-07-17 DIAGNOSIS — J9801 Acute bronchospasm: Secondary | ICD-10-CM | POA: Diagnosis not present

## 2024-07-17 DIAGNOSIS — Z720 Tobacco use: Secondary | ICD-10-CM | POA: Diagnosis not present

## 2024-07-17 LAB — BASIC METABOLIC PANEL WITH GFR
Anion gap: 11 (ref 5–15)
BUN: 30 mg/dL — ABNORMAL HIGH (ref 8–23)
CO2: 24 mmol/L (ref 22–32)
Calcium: 8.9 mg/dL (ref 8.9–10.3)
Chloride: 105 mmol/L (ref 98–111)
Creatinine, Ser: 0.78 mg/dL (ref 0.44–1.00)
GFR, Estimated: >60 mL/min (ref >60)
Glucose, Bld: 122 mg/dL — ABNORMAL HIGH (ref 70–99)
Potassium: 4.3 mmol/L (ref 3.5–5.1)
Sodium: 140 mmol/L (ref 135–145)

## 2024-07-17 LAB — CBC WITH DIFFERENTIAL/PLATELET
Abs Immature Granulocytes: 0.02 K/uL (ref 0.00–0.07)
Basophils Absolute: 0.1 K/uL (ref 0.0–0.1)
Basophils Relative: 1 %
Eosinophils Absolute: 0.3 K/uL (ref 0.0–0.5)
Eosinophils Relative: 4 %
HCT: 40.8 % (ref 36.0–46.0)
Hemoglobin: 14.4 g/dL (ref 12.0–15.0)
Immature Granulocytes: 0 %
Lymphocytes Relative: 18 %
Lymphs Abs: 1.7 K/uL (ref 0.7–4.0)
MCH: 30.6 pg (ref 26.0–34.0)
MCHC: 35.3 g/dL (ref 30.0–36.0)
MCV: 86.6 fL (ref 80.0–100.0)
Monocytes Absolute: 0.8 K/uL (ref 0.1–1.0)
Monocytes Relative: 8 %
Neutro Abs: 6.3 K/uL (ref 1.7–7.7)
Neutrophils Relative %: 69 %
Platelets: 83 K/uL — ABNORMAL LOW (ref 150–400)
RBC: 4.71 MIL/uL (ref 3.87–5.11)
RDW: 16.8 % — ABNORMAL HIGH (ref 11.5–15.5)
Smear Review: NORMAL
WBC: 9.1 K/uL (ref 4.0–10.5)
nRBC: 0 % (ref 0.0–0.2)

## 2024-07-17 LAB — TROPONIN I (HIGH SENSITIVITY): Troponin I (High Sensitivity): 5 ng/L (ref <18)

## 2024-07-17 NOTE — ED Triage Notes (Signed)
 Pt reports she has had sob cough and congestion for the past few weeks, pt was seen at [facility] a few weeks ago and was prescribed steroids and abx with no relief.

## 2024-07-17 NOTE — ED Notes (Signed)
 Pt refused swab

## 2024-07-18 ENCOUNTER — Emergency Department
Admission: EM | Admit: 2024-07-18 | Discharge: 2024-07-18 | Disposition: A | Discharge status: Home / Self Care | Attending: Emergency Medicine | Admitting: Emergency Medicine

## 2024-07-18 DIAGNOSIS — J069 Acute upper respiratory infection, unspecified: Secondary | ICD-10-CM

## 2024-07-18 DIAGNOSIS — J9801 Acute bronchospasm: Secondary | ICD-10-CM

## 2024-07-18 MED ORDER — DOXYCYCLINE HYCLATE 100 MG PO TABS: 100.0000 mg | ORAL_TABLET | Freq: Two times a day (BID) | ORAL | 0 refills | Status: DC

## 2024-07-18 MED ORDER — IPRATROPIUM-ALBUTEROL 0.5-2.5 (3) MG/3ML IN SOLN
3.0000 mL | Freq: Once | RESPIRATORY_TRACT | Status: AC
  Administered 2024-07-18: 3 mL via RESPIRATORY_TRACT
  Filled 2024-07-18: qty 3

## 2024-07-18 MED ORDER — DOXYCYCLINE HYCLATE 100 MG PO TABS
100.0000 mg | ORAL_TABLET | Freq: Once | ORAL | Status: AC
  Administered 2024-07-18: 100 mg via ORAL
  Filled 2024-07-18: qty 1

## 2024-07-18 MED ORDER — ALBUTEROL SULFATE (2.5 MG/3ML) 0.083% IN NEBU
5.0000 mg | INHALATION_SOLUTION | Freq: Once | RESPIRATORY_TRACT | Status: AC
  Administered 2024-07-18: 5 mg via RESPIRATORY_TRACT
  Filled 2024-07-18: qty 6

## 2024-07-18 MED ORDER — IPRATROPIUM BROMIDE 0.02 % IN SOLN
0.5000 mg | Freq: Once | RESPIRATORY_TRACT | Status: AC
  Administered 2024-07-18: 0.5 mg via RESPIRATORY_TRACT
  Filled 2024-07-18: qty 2.5

## 2024-07-18 MED ORDER — PREDNISONE 20 MG PO TABS
60.0000 mg | ORAL_TABLET | Freq: Once | ORAL | Status: AC
  Administered 2024-07-18: 60 mg via ORAL
  Filled 2024-07-18: qty 3

## 2024-07-18 MED ORDER — FLUTICASONE PROPIONATE 50 MCG/ACT NA SUSP: 1.0000 | Freq: Every day | NASAL | 0 refills | Status: DC

## 2024-07-18 MED ORDER — ALBUTEROL SULFATE HFA 108 (90 BASE) MCG/ACT IN AERS
2.0000 | INHALATION_SPRAY | Freq: Once | RESPIRATORY_TRACT | Status: AC
  Administered 2024-07-18: 2 via RESPIRATORY_TRACT
  Filled 2024-07-18: qty 6.7

## 2024-07-18 MED ORDER — PREDNISONE 10 MG PO TABS: 60.0000 mg | ORAL_TABLET | ORAL | 0 refills | Status: DC

## 2024-07-18 MED ORDER — CETIRIZINE HCL 10 MG PO TABS: 10.0000 mg | ORAL_TABLET | Freq: Every day | ORAL | 0 refills | Status: AC

## 2024-07-18 NOTE — ED Notes (Signed)
 Patient endorses feeling better after breathing treatment and inhaler. Ward, MD notified.

## 2024-07-18 NOTE — ED Provider Notes (Signed)
 [facility] Provider Note    Event Date/Time   First MD Initiated Contact with Patient 07/18/24 0107     (approximate)   History   Shortness of Breath   HPI  Rebecca Lara is a 63 y.o. female with history of tobacco use who presents to the emergency department with congestion, sinus drainage, cough, shortness of breath, wheezing ongoing for 2 weeks.  Was seen in urgent care and was started on Augmentin , 6-day prednisone  taper and given an albuterol  inhaler.  States she has been using over the counter guaifenesin as well.  States that she felt like she was getting a little bit better but then symptoms returned.  She states she has not been able to get rid of her shortness of breath though.  No fevers.  Coughing up green mucus.  No history of PE or DVT.  No diagnosis of asthma or COPD.  States she has not been using the albuterol  inhaler because she does not know how to use it.  It appears patient has been seen in the emergency department in urgent care previously for bronchitis with bronchospasm.   History provided by patient, family.    History reviewed. No pertinent past medical history.  Past Surgical History:  Procedure Laterality Date   ABDOMINAL HYSTERECTOMY      MEDICATIONS:  Prior to Admission medications   Medication Sig Start Date End Date Taking? Authorizing Provider  albuterol  (VENTOLIN  HFA) 108 (90 Base) MCG/ACT inhaler Inhale 1-2 puffs into the lungs every 4 (four) hours as needed for wheezing or shortness of breath. 07/03/24   Van Knee, MD  amoxicillin -clavulanate (AUGMENTIN ) 875-125 MG tablet Take 1 tablet by mouth every 12 (twelve) hours. 07/03/24   Van Knee, MD  fluticasone  (FLONASE ) 50 MCG/ACT nasal spray Place 2 sprays into both nostrils daily. 07/03/24   Van Knee, MD  Misc Natural Products (ESTROVEN ENERGY) TABS Take 1 tablet by mouth daily.     [provider]  Multiple Vitamins-Minerals  (MULTIVITAMIN ADULT PO) Take 1 tablet by mouth daily.    [provider]  Olopatadine  HCl 0.2 % SOLN Apply 1 drop to eye daily. 07/03/24   Mortenson, Ashley, MD  Phenylephrine-Aspirin (ALKA-SELTZER PLUS SINUS PO) Take 1 capsule by mouth daily.    [provider]  Spacer/Aero-Holding Chambers (AEROCHAMBER MV) inhaler Use as instructed 07/03/24   Van Knee, MD    Physical Exam   Triage Vital Signs: ED Triage Vitals  Encounter Vitals Group     BP 07/17/24 2232 (!) 166/95     Girls Systolic BP Percentile --      Girls Diastolic BP Percentile --      Boys Systolic BP Percentile --      Boys Diastolic BP Percentile --      Pulse Rate 07/17/24 2232 98     Resp 07/17/24 2232 20     Temp 07/17/24 2232 98.1 F (36.7 C)     Temp Source 07/17/24 2232 Oral     SpO2 07/17/24 2232 100 %     Weight 07/17/24 2231 180 lb (81.6 kg)     Height 07/17/24 2231 5' 7 (1.702 m)     Head Circumference --      Peak Flow --      Pain Score 07/17/24 2231 0     Pain Loc --      Pain Education --      Exclude from Growth Chart --     Most  recent vital signs: Vitals:   07/18/24 0200 07/18/24 0300  BP: (!) 146/82 134/76  Pulse: 96 (!) 106  Resp: 15 16  Temp:  97.6 F (36.4 C)  SpO2: 100% 100%    CONSTITUTIONAL: Alert, responds appropriately to questions. Well-appearing; well-nourished HEAD: Normocephalic, atraumatic EYES: Conjunctivae clear, pupils appear equal, sclera nonicteric ENT: normal nose; moist mucous membranes NECK: Supple, normal ROM CARD: RRR; S1 and S2 appreciated RESP: Normal chest excursion without splinting or tachypnea; breath sounds clear and equal bilaterally; diminished aeration at bases bilaterally, expiratory wheezing, no rhonchi or rales, no respiratory distress or hypoxia, speaking full sentences ABD/GI: Non-distended; soft, non-tender, no rebound, no guarding, no peritoneal signs BACK: The back appears normal EXT: Normal ROM in all joints; no  deformity noted, no edema, no calf tenderness or calf swelling SKIN: Normal color for age and race; warm; no rash on exposed skin NEURO: Moves all extremities equally, normal speech PSYCH: The patient's mood and manner are appropriate.   ED Results / Procedures / Treatments   LABS: (all labs ordered are listed, but only abnormal results are displayed) Labs Reviewed  CBC WITH DIFFERENTIAL/PLATELET - Abnormal; Notable for the following components:      Result Value   RDW 16.8 (*)    Platelets 83 (*)    All other components within normal limits  BASIC METABOLIC PANEL WITH GFR - Abnormal; Notable for the following components:   Glucose, Bld 122 (*)    BUN 30 (*)    All other components within normal limits  TROPONIN I (HIGH SENSITIVITY)     EKG:  EKG Interpretation Date/Time:  Tuesday July 17 2024 22:33:01 EDT Ventricular Rate:  117 PR Interval:  138 QRS Duration:  74 QT Interval:  316 QTC Calculation: 440 R Axis:   92  Text Interpretation: Sinus tachycardia Right atrial enlargement Rightward axis Cannot rule out Anterior infarct , age undetermined Abnormal ECG When compared with ECG of 13-Sep-2015 07:12, No significant change was found Confirmed by Neomi Neptune [phone]) on 07/18/2024 1:54:48 AM         RADIOLOGY: My personal review and interpretation of imaging: Chest x-ray clear.  I have personally reviewed all radiology reports.   DG Chest 2 View Result Date: 07/17/2024 CLINICAL DATA:  Shortness of breath EXAM: CHEST - 2 VIEW COMPARISON:  07/03/2024 FINDINGS: The heart size and mediastinal contours are within normal limits. Both lungs are clear. The visualized skeletal structures are unremarkable. IMPRESSION: No active cardiopulmonary disease. Electronically Signed   By: Luke Bun M.D.   On: 07/17/2024 23:03     PROCEDURES:  Critical Care performed: No     Procedures    IMPRESSION / MDM / ASSESSMENT AND PLAN / ED COURSE  I reviewed the triage vital  signs and the nursing notes.    Patient here with 2 weeks of cough, congestion, wheezing, shortness of breath.  The patient is on the cardiac monitor to evaluate for evidence of arrhythmia and/or significant heart rate changes.   DIFFERENTIAL DIAGNOSIS (includes but not limited to):   Bronchospasm secondary to chronic tobacco use, possible underlying asthma or COPD, viral URI, pneumonia, less likely ACS, CHF.  Doubt PE, pneumothorax.   Patient's presentation is most consistent with acute presentation with potential threat to life or bodily function.   PLAN: Workup initiated from triage.  No leukocytosis.  Normal electrolytes.  Patient is thrombocytopenic which appears to be chronic for her based on thrombocytopenia seen on lab work in 2013.  Troponin  negative.  Chest x-ray reviewed and interpreted by myself and the radiologist and is unremarkable.  Patient declines COVID, flu and RSV testing today.  Will start her on doxycycline  for coverage for community-acquired pneumonia, sinusitis given prolonged symptoms.  Will give breathing treatments, prednisone  here.   MEDICATIONS GIVEN IN ED: Medications  doxycycline  (VIBRA -TABS) tablet 100 mg (100 mg Oral Given 07/18/24 0138)  ipratropium-albuterol  (DUONEB) 0.5-2.5 (3) MG/3ML nebulizer solution 3 mL (3 mLs Nebulization Given 07/18/24 0138)  predniSONE  (DELTASONE ) tablet 60 mg (60 mg Oral Given 07/18/24 0137)  ipratropium-albuterol  (DUONEB) 0.5-2.5 (3) MG/3ML nebulizer solution 3 mL (3 mLs Nebulization Given 07/18/24 0210)  albuterol  (PROVENTIL ) (2.5 MG/3ML) 0.083% nebulizer solution 5 mg (5 mg Nebulization Given 07/18/24 0242)  ipratropium (ATROVENT ) nebulizer solution 0.5 mg (0.5 mg Nebulization Given 07/18/24 0242)  albuterol  (VENTOLIN  HFA) 108 (90 Base) MCG/ACT inhaler 2 puff (2 puffs Inhalation Given 07/18/24 0242)     ED COURSE: Patient now has better aeration but still wheezing slightly.  She states she is feeling better and wants discharge home.   Will discharge with albuterol  inhaler, given instructions on how to use that here.  Will discharge with doxycycline  and prolonged steroid taper.  Have advised her to stop smoking as I suspect that this is contributing to why her symptoms have not improved.  Will discharge.   At this time, I do not feel there is any life-threatening condition present. I reviewed all nursing notes, vitals, pertinent previous records.  All lab and urine results, EKGs, imaging ordered have been independently reviewed and interpreted by myself.  I reviewed all available radiology reports from any imaging ordered this visit.  Based on my assessment, I feel the patient is safe to be discharged home without further emergent workup and can continue workup as an outpatient as needed. Discussed all findings, treatment plan as well as usual and customary return precautions.  They verbalize understanding and are comfortable with this plan.  Outpatient follow-up has been provided as needed.  All questions have been answered.    CONSULTS: Admission considered but patient feeling better and requesting discharge home.   OUTSIDE RECORDS REVIEWED: Reviewed recent urgent care notes.       FINAL CLINICAL IMPRESSION(S) / ED DIAGNOSES   Final diagnoses:  Upper respiratory tract infection, unspecified type  Bronchospasm     Rx / DC Orders   ED Discharge Orders          Ordered    predniSONE  (DELTASONE ) 10 MG tablet  As directed        07/18/24 0316    doxycycline  (VIBRA -TABS) 100 MG tablet  2 times daily        07/18/24 0316    fluticasone  (FLONASE ) 50 MCG/ACT nasal spray  Daily        07/18/24 0316    cetirizine  (ZYRTEC  ALLERGY) 10 MG tablet  Daily        07/18/24 0316             Note:  This document was prepared using Dragon voice recognition software and may include unintentional dictation errors.   Adrijana Haros, Josette SAILOR, DO 07/18/24 [id number]

## 2024-07-18 NOTE — Discharge Instructions (Signed)
 You may alternate over the counter Tylenol 1000 mg every 6 hours as needed for pain, fever and Ibuprofen 800 mg every 6-8 hours as needed for pain, fever.  Please take Ibuprofen with food.  Do not take more than 4000 mg of Tylenol (acetaminophen) in a 24 hour period.  You may use over-the-counter dextromethorphan, guaifenesin, saline nasal spray, Afrin as needed.  Please do not use Afrin nasal spray for more than 3 days as this can worsen nasal congestion.  I recommend using Flonase  daily.  Please use your albuterol  inhaler 2 to 4 puffs every 2-4 hours as needed for shortness of breath and wheezing.

## 2024-07-18 NOTE — ED Notes (Signed)
 Patient given discharge instructions including prescriptions x4 and importance of follow up appt as needed with stated understanding. Patient stable and ambulatory with steady even gait on dispo.

## 2024-07-19 ENCOUNTER — Ambulatory Visit

## 2024-08-23 ENCOUNTER — Ambulatory Visit (INDEPENDENT_AMBULATORY_CARE_PROVIDER_SITE_OTHER)

## 2024-08-23 VITALS — BP 150/90 | HR 102 | Ht 67.0 in | Wt 170.0 lb

## 2024-08-23 DIAGNOSIS — Z23 Encounter for immunization: Secondary | ICD-10-CM

## 2024-08-23 DIAGNOSIS — D696 Thrombocytopenia, unspecified: Secondary | ICD-10-CM | POA: Diagnosis not present

## 2024-08-23 DIAGNOSIS — I10 Essential (primary) hypertension: Secondary | ICD-10-CM | POA: Diagnosis not present

## 2024-08-23 DIAGNOSIS — J441 Chronic obstructive pulmonary disease with (acute) exacerbation: Secondary | ICD-10-CM | POA: Diagnosis not present

## 2024-08-23 DIAGNOSIS — H6063 Unspecified chronic otitis externa, bilateral: Secondary | ICD-10-CM

## 2024-08-23 DIAGNOSIS — J449 Chronic obstructive pulmonary disease, unspecified: Secondary | ICD-10-CM | POA: Insufficient documentation

## 2024-08-23 LAB — CBC WITH DIFFERENTIAL/PLATELET
Absolute Lymphocytes: 1321 {cells}/uL (ref 850–3900)
Absolute Monocytes: 694 {cells}/uL (ref 200–950)
Basophils Absolute: 57 {cells}/uL (ref 0–200)
Basophils Relative: 0.6 %
Eosinophils Absolute: 143 {cells}/uL (ref 15–500)
Eosinophils Relative: 1.5 %
HCT: 35.7 % (ref 35.0–45.0)
Hemoglobin: 12.9 g/dL (ref 11.7–15.5)
MCH: 31.4 pg (ref 27.0–33.0)
MCHC: 36.1 g/dL — ABNORMAL HIGH (ref 32.0–36.0)
MCV: 86.9 fL (ref 80.0–100.0)
MPV: 10.9 fL (ref 7.5–12.5)
Monocytes Relative: 7.3 %
Neutro Abs: 7287 {cells}/uL (ref 1500–7800)
Neutrophils Relative %: 76.7 %
Platelets: 141 Thousand/uL (ref 140–400)
RBC: 4.11 Million/uL (ref 3.80–5.10)
RDW: 15.9 % — ABNORMAL HIGH (ref 11.0–15.0)
Total Lymphocyte: 13.9 %
WBC: 9.5 Thousand/uL (ref 3.8–10.8)

## 2024-08-23 MED ORDER — FLUOCINOLONE ACETONIDE 0.01 % OT OIL: 5.0000 [drp] | TOPICAL_OIL | Freq: Two times a day (BID) | OTIC | 0 refills | Status: AC

## 2024-08-23 MED ORDER — UMECLIDINIUM-VILANTEROL 62.5-25 MCG/ACT IN AEPB: 1.0000 | INHALATION_SPRAY | Freq: Every day | RESPIRATORY_TRACT | 6 refills | Status: DC

## 2024-08-23 NOTE — Patient Instructions (Signed)
Influenza (Flu) Vaccine (Inactivated or Recombinant): What You Need to Know Many vaccine information statements are available in Spanish and other languages. See PromoAge.com.br. 1. Why get vaccinated? Influenza vaccine can prevent influenza (flu). Flu is a contagious disease that spreads around the [place] every year, usually between October and May. Anyone can get the flu, but it is more dangerous for some people. Infants and young children, people 58 years and older, pregnant people, and people with certain health conditions or a weakened immune system are at greatest risk of flu complications. Pneumonia, bronchitis, sinus infections, and ear infections are examples of flu-related complications. If you have a medical condition, such as heart disease, cancer, or diabetes, flu can make it worse. Flu can cause fever and chills, sore throat, muscle aches, fatigue, cough, headache, and runny or stuffy nose. Some people may have vomiting and diarrhea, though this is more common in children than adults. In an average year, thousands of people in the [place] die from flu, and many more are hospitalized. Flu vaccine prevents millions of illnesses and flu-related visits to the doctor each year. 2. Influenza vaccines CDC recommends everyone 6 months and older get vaccinated every flu season. Children 6 months through 51 years of age may need 2 doses during a single flu season. Everyone else needs only 1 dose each flu season. It takes about 2 weeks for protection to develop after vaccination. There are many flu viruses, and they are always changing. Each year a new flu vaccine is made to protect against the influenza viruses believed to be likely to cause disease in the upcoming flu season. Even when the vaccine doesn't exactly match these viruses, it may still provide some protection. Influenza vaccine does not cause flu. Influenza vaccine may be given at the same time as other vaccines. 3.  Talk with your health care provider Tell your vaccination provider if the person getting the vaccine: Has had an allergic reaction after a previous dose of influenza vaccine, or has any severe, life-threatening allergies Has ever had Guillain-Barr Syndrome (also called "GBS") In some cases, your health care provider may decide to postpone influenza vaccination until a future visit. Influenza vaccine can be administered at any time during pregnancy. People who are or will be pregnant during influenza season should receive inactivated influenza vaccine. People with minor illnesses, such as a cold, may be vaccinated. People who are moderately or severely ill should usually wait until they recover before getting influenza vaccine. Your health care provider can give you more information. 4. Risks of a vaccine reaction Soreness, redness, and swelling where the shot is given, fever, muscle aches, and headache can happen after influenza vaccination. There may be a very small increased risk of Guillain-Barr Syndrome (GBS) after inactivated influenza vaccine (the flu shot). Young children who get the flu shot along with pneumococcal vaccine (PCV13) and/or DTaP vaccine at the same time might be slightly more likely to have a seizure caused by fever. Tell your health care provider if a child who is getting flu vaccine has ever had a seizure. People sometimes faint after medical procedures, including vaccination. Tell your provider if you feel dizzy or have vision changes or ringing in the ears. As with any medicine, there is a very remote chance of a vaccine causing a severe allergic reaction, other serious injury, or death. 5. What if there is a serious problem? An allergic reaction could occur after the vaccinated person leaves the clinic. If you see signs of  a severe allergic reaction (hives, swelling of the face and throat, difficulty breathing, a fast heartbeat, dizziness, or weakness), call 9-1-1 and get  the person to the nearest hospital. For other signs that concern you, call your health care provider. Adverse reactions should be reported to the Vaccine Adverse Event Reporting System (VAERS). Your health care provider will usually file this report, or you can do it yourself. Visit the VAERS website at www.vaers.LAgents.no or call (774)365-3656. VAERS is only for reporting reactions, and VAERS staff members do not give medical advice. 6. The National Vaccine Injury Compensation Program The [employer] Vaccine Injury Compensation Program (VICP) is a federal program that was created to compensate people who may have been injured by certain vaccines. Claims regarding alleged injury or death due to vaccination have a time limit for filing, which may be as short as two years. Visit the VICP website at SpiritualWord.at or call 3187036210 to learn about the program and about filing a claim. 7. How can I learn more? Ask your health care provider. Call your local or state health department. Visit the website of the Food and Drug Administration (FDA) for vaccine package inserts and additional information at FinderList.no. Contact the Centers for Disease Control and Prevention (CDC): Call 787-694-5930 (1-800-CDC-INFO) or Visit CDC's website at BiotechRoom.com.cy. Source: CDC Vaccine Information Statement Inactivated Influenza Vaccine (07/18/2020) This same material is available at FootballExhibition.com.br for no charge. This information is not intended to replace advice given to you by your health care provider. Make sure you discuss any questions you have with your health care provider. Document Revised: 03/16/2023 Document Reviewed: 12/20/2022 Elsevier Patient Education  2024 [employer].

## 2024-08-23 NOTE — Progress Notes (Signed)
 New Patient Visit   Physician: Shanekqua Schaper A Amyriah Buras, MD  Patient: Rebecca Lara   DOB: [birth year]   63 y.o. Female  MRN: [id number] Visit Date: 08/23/2024   Chief Complaint  Patient presents with   Establish Care   Shortness of Breath   Subjective  Rebecca Lara is a 63 y.o. female who presents today as a new patient to establish care.   HPI  Discussed the use of AI scribe software for clinical note transcription with the patient, who gave verbal consent to proceed.  History of Present Illness   Rebecca Lara is a 63 year old female who presents with shortness of breath.  Dyspnea - Shortness of breath for the past couple of months - Initially severe, requiring her to stop and catch her breath even when walking through a room - Most severe in the morning - Exacerbated by physical activity, such as climbing stairs or walking up hills - Avoids physical exertion due to fear of worsening symptoms - Symptoms improved over the last week with over-the-counter Reconyx, which she feels breaks up phlegm - Uses albuterol  inhaler a couple of times a day, particularly in the morning, with improvement in breathing - No significant phlegm production - Shortness of breath began suddenly after an illness in July  Recent respiratory illness and interventions - Acute illness in July, after which dyspnea began - Visited emergency room during initial illness; received nebulizer treatments and antibiotics without significant improvement - Visited urgent care on Labor Day; advised to try Gamunex, which she feels has helped significantly - No pulmonary function test or CT scan of the chest performed  Tobacco use - Smoked for approximately thirty years - Currently smoking less than half a pack per week  Hematologic findings - History of thrombocytopenia - Recent platelet count of 83,000/L  Past medical history - History of double hernias - History of herniated discs          ASSESSMENT & PLAN  Encounter Diagnoses  Name Primary?   Thrombocytopenia (HCC) Yes   Chronic obstructive pulmonary disease with acute exacerbation (HCC)    Need for influenza vaccination    Hypertension, unspecified type     Orders Placed This Encounter  Procedures   Flu vaccine trivalent PF, 6mos and older(Flulaval,Afluria,Fluarix,Fluzone)   CBC with Differential/Platelet    Assessment and Plan    COPD with acute exacerbation Chronic obstructive pulmonary disease assumed with acute exacerbation characterized by shortness of breath and phlegm production, likely triggered by a respiratory infection. Seen in ED. Symptoms have improved recently. Smoking history of over 30 years. Differential includes possible coronary artery disease contribution. Significant improvement with over-the-counter medication and reduced smoking. Cost concerns limit immediate further testing. - Order pulmonary function test to assess lung status - this is on hold due to cost - Prescribe a baseline inhaler with inhaled steroid for daily use - Anoro  - Continue albuterol  inhaler as rescue medication - Consider nebulizer machine for home use if symptoms persist - Hold off on CT scan due to cost concerns - Follow up in 10 days to assess response to treatment  - Ambulatory SaO2 to 92%  Thrombocytopenia Thrombocytopenia with platelet count at 83,000. No prior history of low platelets. Possible reactive thrombocytopenia due to recent viral infection. Family history of leukemia in father. - Order CBC to recheck platelet count.  Morphology previously normal  Hypertension Blood pressure recorded at 150/90 in office. Normal blood pressure readings at home previously. No  home blood pressure monitoring currently in place. - Recommend obtaining a home blood pressure cuff for monitoring - Advise to check blood pressure at home and record values  Bilateral otitis externa Bilateral otitis externa with redness in the  ear canals. History of ear issues due to drainage tubes as a child. Occasional sharp stabbing pain in the ear, likely due to irritation or eczema in the ear canal. - Prescribe ear drops to address redness and irritation in the ear canals  General Health Maintenance Discussion of flu vaccination and pneumonia vaccination. Regular flu vaccinations received. No recent mammogram or colonoscopy due to cost concerns. Prefers to address health issues incrementally due to financial constraints. - Administer flu vaccine today - Discuss pneumonia vaccine at a later date         Objective  BP (!) 150/90   Pulse (!) 102   Ht 5' 7 (1.702 m)   Wt 170 lb (77.1 kg)   SpO2 96%   BMI 26.63 kg/m    Physical Exam Vitals reviewed.  Constitutional:      Appearance: Normal appearance. Well-developed with normal weight.  HENT:     Head: Normocephalic and atraumatic.  Normal mucous membranes, no oral lesions Eyes:     Pupils: Pupils are equal, round, and reactive to light.  Neck:     Thyroid: No thyroid mass or thyromegaly.  Cardiovascular:     Rate and Rhythm: Normal rate and regular rhythm. Normal heart sounds. Normal peripheral pulses Pulmonary:    Breath sounds course bilat, no increased WOB Abdominal:   Abdomen is soft, without tenderness or noted hepatosplenomegaly Musculoskeletal:        General: No swelling or edema  Lymphadenopathy:     Cervical: No cervical adenopathy.  Skin:    General: Skin is warm and dry without noticeable rash. Neurological:     General: No focal deficit present.  Psychiatric:        Mood and Affect: Mood, behavior and cognition normal     Review of Systems  Constitutional:  Negative for chills, fever and weight loss.  Eyes:  Negative for blurred vision. h Respiratory:  Negative for cough and shortness of breath.   No family history on file. Social History   Socioeconomic History   Marital status: Married    Spouse name: Not on file   Number of  children: Not on file   Years of education: Not on file   Highest education level: Not on file  Occupational History   Not on file  Tobacco Use   Smoking status: Every Day    Current packs/day: 0.50    Types: Cigarettes   Smokeless tobacco: Never  Vaping Use   Vaping status: Never Used  Substance and Sexual Activity   Alcohol use: No    Alcohol/week: 0.0 standard drinks of alcohol   Drug use: No   Sexual activity: Yes    Birth control/protection: Surgical  Other Topics Concern   Not on file  Social History Narrative   Not on file   Social Drivers of Health   Financial Resource Strain: Not on file  Food Insecurity: Not on file  Transportation Needs: Not on file  Physical Activity: Not on file  Stress: Not on file  Social Connections: Not on file   Outpatient Medications Prior to Visit  Medication Sig   albuterol  (VENTOLIN  HFA) 108 (90 Base) MCG/ACT inhaler Inhale 1-2 puffs into the lungs every 4 (four) hours as needed for wheezing or shortness  of breath.   cetirizine  (ZYRTEC  ALLERGY) 10 MG tablet Take 1 tablet (10 mg total) by mouth daily.   EPINEPHrine (PRIMATENE MIST IN) Inhale into the lungs as needed.   fluticasone  (FLONASE ) 50 MCG/ACT nasal spray Place 1 spray into both nostrils daily.   Multiple Vitamins-Minerals (MULTIVITAMIN ADULT PO) Take 1 tablet by mouth daily.   [DISCONTINUED] doxycycline  (VIBRA -TABS) 100 MG tablet Take 1 tablet (100 mg total) by mouth 2 (two) times daily.   [DISCONTINUED] Misc Natural Products (ESTROVEN ENERGY) TABS Take 1 tablet by mouth daily.    [DISCONTINUED] Olopatadine  HCl 0.2 % SOLN Apply 1 drop to eye daily.   [DISCONTINUED] Phenylephrine-Aspirin (ALKA-SELTZER PLUS SINUS PO) Take 1 capsule by mouth daily.   [DISCONTINUED] predniSONE  (DELTASONE ) 10 MG tablet Take 6 tablets (60 mg total) by mouth as directed. Take 60 mg x 2 days then 50 mg x 2 days then 40 mg x 2 days then 30 mg x 2 days then 20 mg x 2 days then 10 mg x 2 days then STOP    [DISCONTINUED] Spacer/Aero-Holding Chambers (AEROCHAMBER MV) inhaler Use as instructed   No facility-administered medications prior to visit.   Allergies  Allergen Reactions   Biaxin [Clarithromycin] Nausea And Vomiting    There is no immunization history for the selected administration types on file for this patient.  Health Maintenance  Topic Date Due   HIV Screening  Never done   Hepatitis C Screening  Never done   DTaP/Tdap/Td (1 - Tdap) Never done   Pneumococcal Vaccine: 50+ Years (1 of 2 - PCV) Never done   Cervical Cancer Screening (HPV/Pap Cotest)  Never done   Mammogram  Never done   Colonoscopy  Never done   Zoster Vaccines- Shingrix (1 of 2) Never done   Influenza Vaccine  Never done   COVID-19 Vaccine (1 - 2024-25 season) Never done   Hepatitis B Vaccines 19-59 Average Risk  Aged Out   HPV VACCINES  Aged Out   Meningococcal B Vaccine  Aged Out    Patient Care Team: Practice, [facility] as PCP - General  Depression Screen    08/23/2024    8:56 AM  PHQ 2/9 Scores  PHQ - 2 Score 0  PHQ- 9 Score 0     Parris DELENA Juneau, MD  [facility] [phone] (phone) [phone] (fax)  [facility]

## 2024-08-24 ENCOUNTER — Other Ambulatory Visit: Payer: Self-pay

## 2024-08-24 ENCOUNTER — Other Ambulatory Visit (HOSPITAL_COMMUNITY): Payer: Self-pay

## 2024-08-24 ENCOUNTER — Telehealth: Payer: Self-pay

## 2024-08-24 MED ORDER — STIOLTO RESPIMAT 2.5-2.5 MCG/ACT IN AERS: 2.0000 | INHALATION_SPRAY | Freq: Every day | RESPIRATORY_TRACT | 3 refills | Status: DC

## 2024-08-24 NOTE — Telephone Encounter (Signed)
 Pharmacy Patient Advocate Encounter   Received notification from Onbase that prior authorization for Anoro Ellipta  62.5-25 mcg inhaler is required/requested.   Insurance verification completed.   The patient is insured through [facility] .   Per test claim:  Bevespi aerosphere or Stiolto Respimat  is preferred by the insurance.  If suggested medication is appropriate, Please send in a new RX and discontinue this one. If not, please advise as to why it's not appropriate so that we may request a Prior Authorization. Please note, some preferred medications may still require a PA.  If the suggested medications have not been trialed and there are no contraindications to their use, the PA will not be submitted, as it will not be approved.

## 2024-08-27 ENCOUNTER — Other Ambulatory Visit: Payer: Self-pay

## 2024-08-27 MED ORDER — UMECLIDINIUM-VILANTEROL 62.5-25 MCG/ACT IN AEPB: 1.0000 | INHALATION_SPRAY | Freq: Every day | RESPIRATORY_TRACT | 3 refills | Status: DC

## 2024-08-29 ENCOUNTER — Other Ambulatory Visit: Payer: Self-pay

## 2024-08-29 ENCOUNTER — Telehealth: Payer: Self-pay

## 2024-08-29 ENCOUNTER — Other Ambulatory Visit (HOSPITAL_COMMUNITY): Payer: Self-pay

## 2024-08-29 MED ORDER — BUDESON-GLYCOPYRROL-FORMOTEROL 160-9-4.8 MCG/ACT IN AERO: 2.0000 | INHALATION_SPRAY | Freq: Two times a day (BID) | RESPIRATORY_TRACT | 3 refills | Status: DC

## 2024-08-29 NOTE — Telephone Encounter (Signed)
 Pharmacy Patient Advocate Encounter   Received notification from Physician's Office that prior authorization for Anoro Ellipta  is required/requested.   Insurance verification completed.   The patient is insured through [employer] .   Per test claim:  Stiolto (pt had a reaction) and Breztri (not tried) is preferred by the insurance.  If suggested medication is appropriate, Please send in a new RX and discontinue this one. If not, please advise as to why it's not appropriate so that we may request a Prior Authorization. Please note, some preferred medications may still require a PA.  If the suggested medications have not been trialed and there are no contraindications to their use, the PA will not be submitted, as it will not be approved.

## 2024-08-29 NOTE — Telephone Encounter (Signed)
 Copied from CRM [id number]. Topic: Clinical - Prescription Issue >> Aug 29, 2024  9:54 AM Rebecca Lara wrote: Reason for CRM: umeclidinium-vilanterol (ANORO ELLIPTA ) 62.5-25 MCG/ACT AEPB   Pts spouse called and said they are just waiting on a prior auth for this medication. He ask that it be put urgent.

## 2024-08-29 NOTE — Telephone Encounter (Signed)
 Unfortunately, pt's plan is going to require her to trial both of the preferred medications before they will approve the Anoro. I see that she has had a reaction to Stiolto, insurance still requires that she trial Breztri. Please change if clinically appropriate, or advise (and please add stiolto to allergy list for any future PA attempts) thank you

## 2024-09-04 ENCOUNTER — Ambulatory Visit

## 2024-09-10 NOTE — Progress Notes (Unsigned)
            Progress Note  Physician: Elveta Rape A Kaden Dunkel, MD   HPI: Rebecca Lara is a 63 y.o. female presenting on 09/11/2024 for No chief complaint on file. .  Discussed the use of AI scribe software for clinical note transcription with the patient, who gave verbal consent to proceed.  History of Present Illness             Medical history:  Relevant past medical, surgical, family and social history reviewed and updated as indicated. Interim medical history since our last visit reviewed.  Allergies and medications reviewed and updated.   ROS: Negative unless specifically indicated above in HPI.    Current Outpatient Medications:  .  albuterol  (VENTOLIN  HFA) 108 (90 Base) MCG/ACT inhaler, Inhale 1-2 puffs into the lungs every 4 (four) hours as needed for wheezing or shortness of breath., Disp: 1 each, Rfl: 0 .  budesonide-glycopyrrolate-formoterol  (BREZTRI) 160-9-4.8 MCG/ACT AERO inhaler, Inhale 2 puffs into the lungs 2 (two) times daily., Disp: 1 each, Rfl: 3 .  cetirizine  (ZYRTEC  ALLERGY) 10 MG tablet, Take 1 tablet (10 mg total) by mouth daily., Disp: 30 tablet, Rfl: 0 .  EPINEPHrine (PRIMATENE MIST IN), Inhale into the lungs as needed., Disp: , Rfl:  .  fluticasone  (FLONASE ) 50 MCG/ACT nasal spray, Place 1 spray into both nostrils daily., Disp: 15.8 mL, Rfl: 0 .  Multiple Vitamins-Minerals (MULTIVITAMIN ADULT PO), Take 1 tablet by mouth daily., Disp: , Rfl:        Objective:     There were no vitals taken for this visit.  Wt Readings from Last 3 Encounters:  08/23/24 170 lb (77.1 kg)  07/17/24 180 lb (81.6 kg)  07/03/24 180 lb (81.6 kg)    Physical Exam  Physical Exam Vitals reviewed.  Constitutional:      Appearance: Normal appearance. Well-developed with normal weight.  Cardiovascular:     Rate and Rhythm: Normal rate and regular rhythm. Normal heart sounds. Normal peripheral pulses Pulmonary:     Normal breath sounds with normal effort Skin:     General: Skin is warm and dry without noticeable rash. Neurological:     General: No focal deficit present.  Psychiatric:        Mood and Affect: Mood, behavior and cognition normal      Assessment & Plan:  No diagnosis found.  No orders of the defined types were placed in this encounter.    Assessment and Plan

## 2024-09-11 ENCOUNTER — Other Ambulatory Visit: Payer: Self-pay

## 2024-09-11 ENCOUNTER — Ambulatory Visit (INDEPENDENT_AMBULATORY_CARE_PROVIDER_SITE_OTHER)

## 2024-09-11 VITALS — BP 149/94 | HR 105 | Ht 67.0 in | Wt 169.6 lb

## 2024-09-11 DIAGNOSIS — J42 Unspecified chronic bronchitis: Secondary | ICD-10-CM | POA: Diagnosis not present

## 2024-09-11 DIAGNOSIS — Z23 Encounter for immunization: Secondary | ICD-10-CM

## 2024-09-11 DIAGNOSIS — I10 Essential (primary) hypertension: Secondary | ICD-10-CM

## 2024-09-11 DIAGNOSIS — D696 Thrombocytopenia, unspecified: Secondary | ICD-10-CM

## 2024-09-11 DIAGNOSIS — R0600 Dyspnea, unspecified: Secondary | ICD-10-CM | POA: Insufficient documentation

## 2024-09-11 MED ORDER — BUDESON-GLYCOPYRROL-FORMOTEROL 160-9-4.8 MCG/ACT IN AERO: 2.0000 | INHALATION_SPRAY | Freq: Two times a day (BID) | RESPIRATORY_TRACT | 6 refills | Status: DC

## 2024-09-11 MED ORDER — ALBUTEROL SULFATE HFA 108 (90 BASE) MCG/ACT IN AERS: 1.0000 | INHALATION_SPRAY | RESPIRATORY_TRACT | 8 refills | Status: DC | PRN

## 2024-09-11 MED ORDER — BUDESON-GLYCOPYRROL-FORMOTEROL 160-9-4.8 MCG/ACT IN AERO: 2.0000 | INHALATION_SPRAY | Freq: Two times a day (BID) | RESPIRATORY_TRACT | 6 refills | Status: AC

## 2024-09-11 MED ORDER — ALBUTEROL SULFATE HFA 108 (90 BASE) MCG/ACT IN AERS: 1.0000 | INHALATION_SPRAY | RESPIRATORY_TRACT | 8 refills | Status: AC | PRN

## 2024-09-11 NOTE — Addendum Note (Signed)
 Addended by: BLINDA DOUGLAS on: 09/11/2024 09:10 AM   Modules accepted: Orders

## 2024-09-14 ENCOUNTER — Telehealth: Payer: Self-pay

## 2024-09-14 DIAGNOSIS — J42 Unspecified chronic bronchitis: Secondary | ICD-10-CM

## 2024-09-14 NOTE — Telephone Encounter (Signed)
 Copied from CRM [id number]. Topic: Clinical - Request for Lab/Test Order >> Sep 14, 2024 10:12 AM Rebecca Lara wrote: Reason for CRM: pt was ordered a CT for lungs, pt called hospital to get scheduled and hosptila would  not schedule pt.   Pt wasasked if she smoked and told the scheduler no and they told her the ct scan is for someone who smokes and would not schedule pt.  Pls follow up with pt regarding CT scan.

## 2024-09-14 NOTE — Telephone Encounter (Signed)
 Copied from CRM #[id number]. Topic: Clinical - Request for Lab/Test Order >> Sep 14, 2024  9:42 AM Everette C wrote: Reason for CRM: Cy with [facility] has called to share that the patient doesn't meet the criteria for a CT Chest lung cancer screening. The patient does not currently smoke one pack of cigarettes per day. Please contact Cy further if needed at [phone]

## 2024-09-14 NOTE — Addendum Note (Signed)
 Addended by: Desmond Tufano A on: 09/14/2024 10:09 AM   Modules accepted: Orders

## 2024-09-19 ENCOUNTER — Ambulatory Visit
Admission: RE | Admit: 2024-09-19 | Discharge: 2024-09-19 | Disposition: A | Discharge status: Home / Self Care | Source: Ambulatory Visit

## 2024-09-19 DIAGNOSIS — J42 Unspecified chronic bronchitis: Secondary | ICD-10-CM | POA: Diagnosis present

## 2024-09-21 ENCOUNTER — Telehealth: Payer: Self-pay

## 2024-09-21 NOTE — Telephone Encounter (Signed)
 Yes, on 09/17, the following note was created regarding anoro ellipta  coverage:

## 2024-09-21 NOTE — Telephone Encounter (Signed)
 Copied from CRM [id number]. Topic: Clinical - Medication Prior Auth >> Sep 21, 2024  8:53 AM Delon HERO wrote: Reason for CRM: Rebecca Lara is calling from [employer] for the prescription drug plan - calling to see if a PA was received for umeclidinium-vilanterol (ANORO ELLIPTA ) 62.5-25 MCG/ACT AEPB [[id number]] DISCONTINUED? Sent 09/04/2024 came back not completed - due the provider not responding or providing enough.  [name] is send another PA through Cover My Meds. Should receive within 24 hours. Patient states that she is running out medication  umeclidinium-vilanterol (ANORO ELLIPTA ) 62.5-25 MCG/ACT AEPB [[id number]] DISCONTINUED

## 2024-09-25 ENCOUNTER — Ambulatory Visit (INDEPENDENT_AMBULATORY_CARE_PROVIDER_SITE_OTHER)

## 2024-09-25 VITALS — BP 130/90 | HR 108 | Ht 67.0 in | Wt 127.0 lb

## 2024-09-25 DIAGNOSIS — I2510 Atherosclerotic heart disease of native coronary artery without angina pectoris: Secondary | ICD-10-CM | POA: Diagnosis not present

## 2024-09-25 DIAGNOSIS — R0600 Dyspnea, unspecified: Secondary | ICD-10-CM

## 2024-09-25 DIAGNOSIS — R911 Solitary pulmonary nodule: Secondary | ICD-10-CM | POA: Insufficient documentation

## 2024-09-25 DIAGNOSIS — I10 Essential (primary) hypertension: Secondary | ICD-10-CM | POA: Diagnosis not present

## 2024-09-25 DIAGNOSIS — R161 Splenomegaly, not elsewhere classified: Secondary | ICD-10-CM | POA: Insufficient documentation

## 2024-09-25 DIAGNOSIS — J42 Unspecified chronic bronchitis: Secondary | ICD-10-CM

## 2024-09-25 MED ORDER — AMOXICILLIN-POT CLAVULANATE 875-125 MG PO TABS: 1.0000 | ORAL_TABLET | Freq: Two times a day (BID) | ORAL | 0 refills | Status: AC

## 2024-09-25 MED ORDER — ROSUVASTATIN CALCIUM 20 MG PO TABS: 20.0000 mg | ORAL_TABLET | Freq: Every day | ORAL | 1 refills | Status: AC

## 2024-09-25 NOTE — Progress Notes (Signed)
 Progress Note  Physician: Rayford Williamsen A Kaan Tosh, MD   HPI: Rebecca Lara is a 63 y.o. female presenting on 09/25/2024 for No chief complaint on file. .  Discussed the use of AI scribe software for clinical note transcription with the patient, who gave verbal consent to proceed.  History of Present Illness        Patient seen in follow-up for review of her chest CT which she had complete in the setting of COPD.  Patient has a pulmonary nodule which is present in the anterior right upper lobe measuring 0.8 x 0.5 cm.  She has a partial imaging of the spleen which shows severe splenomegaly at maximum span of at least 16 cm patient CT showing moderate emphysema and diffuse bronchial wall thickening smoking-related respiratory bronchiolitis patient has coronary artery calcifications and aortic atherosclerosis as well on imaging.  Some issues with coverage for breztri and getting insurance approvals for this patient.  She did have significant improvement with use of Anoro and less so with breast tree but we have had issues with allergic reaction to Anoro.  Otherwise, patient is tolerating diet weight is stable.  Blood pressure today in office 130/90 and is better controlled at home.     Medical history:  Relevant past medical, surgical, family and social history reviewed and updated as indicated. Interim medical history since our last visit reviewed.  Allergies and medications reviewed and updated.   ROS: Negative unless specifically indicated above in HPI.    Current Outpatient Medications:    albuterol  (VENTOLIN  HFA) 108 (90 Base) MCG/ACT inhaler, Inhale 1-2 puffs into the lungs every 4 (four) hours as needed for wheezing or shortness of breath., Disp: 1 each, Rfl: 8   budesonide-glycopyrrolate-formoterol  (BREZTRI) 160-9-4.8 MCG/ACT AERO inhaler, Inhale 2 puffs into the lungs 2 (two) times daily., Disp: 1 each, Rfl: 6   cetirizine  (ZYRTEC  ALLERGY) 10 MG tablet, Take 1 tablet  (10 mg total) by mouth daily., Disp: 30 tablet, Rfl: 0   EPINEPHrine (PRIMATENE MIST IN), Inhale into the lungs as needed., Disp: , Rfl:    fluticasone  (FLONASE ) 50 MCG/ACT nasal spray, Place 1 spray into both nostrils daily., Disp: 15.8 mL, Rfl: 0   Multiple Vitamins-Minerals (MULTIVITAMIN ADULT PO), Take 1 tablet by mouth daily., Disp: , Rfl:        Objective:     There were no vitals taken for this visit.  Wt Readings from Last 3 Encounters:  09/11/24 169 lb 9.6 oz (76.9 kg)  08/23/24 170 lb (77.1 kg)  07/17/24 180 lb (81.6 kg)    Physical Exam  Physical Exam Vitals reviewed.  Constitutional:      Appearance: Normal appearance. Well-developed with normal weight.  Cardiovascular:     Rate and Rhythm: Normal rate and regular rhythm. Normal heart sounds. Normal peripheral pulses Pulmonary:     Bilateral course breath sounds lung base.  Skin:    General: Skin is warm and dry without noticeable rash. Neurological:     General: No focal deficit present.  Psychiatric:        Mood and Affect: Mood, behavior and cognition normal      Assessment & Plan:   Encounter Diagnoses  Name Primary?   Splenomegaly Yes    No orders of the defined types were placed in this encounter.    Assessment and Plan        #1 pulmonary nodule.  Spiculated appearance in the upper  lobe with higher chance of malignancy.  Given her smoking history it is reasonable to do a PET scan.  2.  Splenomegaly.  She does need repeat labs including CBC CMP, LDH, peripheral smear.  Prior to this, we will do an ultrasound for further evaluation of the liver.  3.  Coronary artery disease.  Patient high risk for significant coronary artery disease given smoking history.  Will start her on rosuvastatin 20 mg daily.  She should take low-dose aspirin 81 mg.  4. COPD - Plan to have her follow-up after PET scan complete or as needed.  If she continues to have significant shortness of breath repeat pulmonary function  test with a visit to pulmonology would be appropriate.  She does use baseline inhaler and has a rescue inhaler at home as well.  No signs of ongoing significant infection she does just have a small amount of sputum in the morning.  Feels that her shortness of breath is increased since obtaining the covid vaccine.  Vaccine.  Fine mild nodularity may be indicative of another secondary process.  We can trial Augmentin  to see if there is improvement in her overall symptoms but if not, will have pulmonology see her

## 2024-09-25 NOTE — Telephone Encounter (Signed)
 Pharmacy Patient Advocate Encounter   Received notification from Physician's Office that prior authorization for Anoro Ellipta  is required/requested.   Insurance verification completed.   The patient is insured through [employer] .   Per test claim:  Stiolto (pt had a reaction) and Breztri (not tried) is preferred by the insurance.  If suggested medication is appropriate, Please send in a new RX and discontinue this one. If not, please advise as to why it's not appropriate so that we may request a Prior Authorization. Please note, some preferred medications may still require a PA.  If the suggested medications have not been trialed and there are no contraindications to their use, the PA will not be submitted, as it will not be approved.

## 2024-09-25 NOTE — Progress Notes (Signed)
 Left message for patient to return call OK to advise  of instructions when patient calls back

## 2024-10-03 ENCOUNTER — Ambulatory Visit: Admission: RE | Admit: 2024-10-03 | Discharge: 2024-10-03 | Disposition: A | Source: Ambulatory Visit

## 2024-10-03 ENCOUNTER — Other Ambulatory Visit: Payer: Self-pay

## 2024-10-03 ENCOUNTER — Ambulatory Visit
Admission: RE | Admit: 2024-10-03 | Discharge: 2024-10-03 | Disposition: A | Discharge status: Home / Self Care | Source: Ambulatory Visit

## 2024-10-03 VITALS — Ht 67.0 in | Wt 170.0 lb

## 2024-10-03 DIAGNOSIS — I2510 Atherosclerotic heart disease of native coronary artery without angina pectoris: Secondary | ICD-10-CM | POA: Insufficient documentation

## 2024-10-03 DIAGNOSIS — R161 Splenomegaly, not elsewhere classified: Secondary | ICD-10-CM

## 2024-10-03 DIAGNOSIS — I700 Atherosclerosis of aorta: Secondary | ICD-10-CM | POA: Diagnosis not present

## 2024-10-03 DIAGNOSIS — R911 Solitary pulmonary nodule: Secondary | ICD-10-CM | POA: Insufficient documentation

## 2024-10-03 LAB — GLUCOSE, CAPILLARY: Glucose-Capillary: 100 mg/dL — ABNORMAL HIGH (ref 70–99)

## 2024-10-03 MED ORDER — FLUDEOXYGLUCOSE F - 18 (FDG) INJECTION
8.0900 | Freq: Once | INTRAVENOUS | Status: AC | PRN
Start: 2024-10-03 — End: 2024-10-03
  Administered 2024-10-03: 8.09 via INTRAVENOUS

## 2024-10-03 NOTE — Telephone Encounter (Unsigned)
 Copied from CRM [id number]. Topic: Clinical - Medication Refill >> Oct 03, 2024 10:33 AM Tysheama G wrote: Medication: fluticasone  (FLONASE ) 50 MCG/ACT nasal spray  Has the patient contacted their pharmacy? Yes (Agent: If no, request that the patient contact the pharmacy for the refill. If patient does not wish to contact the pharmacy document the reason why and proceed with request.) (Agent: If yes, when and what did the pharmacy advise?)  This is the patient's preferred pharmacy:  [id number] - [place], [place] - [address] [place] [address] Phone: [phone] Fax: [phone]    Is this the correct pharmacy for this prescription? Yes If no, delete pharmacy and type the correct one.   Has the prescription been filled recently? No  Is the patient out of the medication? Yes  Has the patient been seen for an appointment in the last year OR does the patient have an upcoming appointment? Yes  Can we respond through MyChart? Yes  Agent: Please be advised that Rx refills may take up to 3 business days. We ask that you follow-up with your pharmacy.

## 2024-10-05 ENCOUNTER — Ambulatory Visit: Payer: Self-pay

## 2024-10-05 MED ORDER — FLUTICASONE PROPIONATE 50 MCG/ACT NA SUSP: 1.0000 | Freq: Every day | NASAL | 0 refills | Status: DC

## 2024-10-05 NOTE — Telephone Encounter (Signed)
 Requested medications are due for refill today.  yes  Requested medications are on the active medications list.  yes  Last refill. 07/18/2024 15.35mL 0 rf  Future visit scheduled.   no  Notes to clinic.  Rx signed by Josette Sink.    Requested Prescriptions  Pending Prescriptions Disp Refills   fluticasone  (FLONASE ) 50 MCG/ACT nasal spray 15.8 mL 0    Sig: Place 1 spray into both nostrils daily.     Ear, Nose, and Throat: Nasal Preparations - Corticosteroids Passed - 10/05/2024  9:45 AM      Passed - Valid encounter within last 12 months    Recent Outpatient Visits           1 week ago Splenomegaly   [facility] [place], [place] A, MD   3 weeks ago Chronic bronchitis, unspecified chronic bronchitis type [facility])   [place] [facility] LABOR, MD   1 month ago Thrombocytopenia   [place] [facility] Ever[facility] Parris LABOR, MD

## 2024-10-10 ENCOUNTER — Other Ambulatory Visit
Admission: RE | Admit: 2024-10-10 | Discharge: 2024-10-10 | Disposition: A | Discharge status: Home / Self Care | Source: Ambulatory Visit

## 2024-10-10 ENCOUNTER — Ambulatory Visit (INDEPENDENT_AMBULATORY_CARE_PROVIDER_SITE_OTHER)

## 2024-10-10 VITALS — BP 151/95 | HR 95 | Ht 67.0 in | Wt 172.6 lb

## 2024-10-10 DIAGNOSIS — R161 Splenomegaly, not elsewhere classified: Secondary | ICD-10-CM

## 2024-10-10 DIAGNOSIS — R911 Solitary pulmonary nodule: Secondary | ICD-10-CM

## 2024-10-10 DIAGNOSIS — J42 Unspecified chronic bronchitis: Secondary | ICD-10-CM

## 2024-10-10 NOTE — Addendum Note (Signed)
 Addended by: Chyanne Kohut A on: 10/10/2024 01:53 PM   Modules accepted: Orders

## 2024-10-10 NOTE — Progress Notes (Signed)
 Progress Note  Physician: Filippa Yarbough A Chandi Nicklin, MD   HPI: Rebecca Lara is a 63 y.o. female presenting on 10/10/2024 for Follow-up .  Discussed the use of AI scribe software for clinical note transcription with the patient, who gave verbal consent to proceed.  History of Present Illness   Rebecca Lara is a 63 year old female with COPD who presents with shortness of breath and an enlarged spleen.  Dyspnea and cough in the setting of COPD - Persistent shortness of breath for 5-6 months, particularly with walking or standing - Dyspnea after climbing thirteen steps in her house, cmfortable only when sitting or sleeping - No shortness of breath at rest or while sitting - Breathing difficulties have limited ability to perform ADL's - Continues to cough up mucus - No significant change in lung function after a course of amoxicillin , though breathing felt less restricted  Splenomegaly - Enlarged spleen, confirmed by PET scan and ultrasound measuring sixteen centimeters - Believes spleen is pressing on lungs and affecting diaphragm, making deep breaths difficult Spitting up food after eating too much or coughing, occurring three times in the last couple of months  Tobacco use and environmental exposure - Current smoker, one to two cigarettes per day - Desires to quit smoking - Husband smokes approximately one pack per day   Medical history:  Relevant past medical, surgical, family and social history reviewed and updated as indicated. Interim medical history since our last visit reviewed.  Allergies and medications reviewed and updated.   ROS: Negative unless specifically indicated above in HPI.    Current Outpatient Medications:    albuterol  (VENTOLIN  HFA) 108 (90 Base) MCG/ACT inhaler, Inhale 1-2 puffs into the lungs every 4 (four) hours as needed for wheezing or shortness of breath., Disp: 1 each, Rfl: 8   budesonide-glycopyrrolate-formoterol  (BREZTRI) 160-9-4.8  MCG/ACT AERO inhaler, Inhale 2 puffs into the lungs 2 (two) times daily., Disp: 1 each, Rfl: 6   cetirizine  (ZYRTEC  ALLERGY) 10 MG tablet, Take 1 tablet (10 mg total) by mouth daily., Disp: 30 tablet, Rfl: 0   EPINEPHrine (PRIMATENE MIST IN), Inhale into the lungs as needed., Disp: , Rfl:    fluticasone  (FLONASE ) 50 MCG/ACT nasal spray, Place 1 spray into both nostrils daily., Disp: 15.8 mL, Rfl: 0   Multiple Vitamins-Minerals (MULTIVITAMIN ADULT PO), Take 1 tablet by mouth daily., Disp: , Rfl:    rosuvastatin (CRESTOR) 20 MG tablet, Take 1 tablet (20 mg total) by mouth daily., Disp: 90 tablet, Rfl: 1   Spacer/Aero-Holding Chambers (OPTICHAMBER DIAMOND) MISC, as directed., Disp: , Rfl:        Objective:     Pulse (!) 104   Ht 5' 7 (1.702 m)   Wt 172 lb 9.6 oz (78.3 kg)   SpO2 93%   BMI 27.03 kg/m   Wt Readings from Last 3 Encounters:  10/10/24 172 lb 9.6 oz (78.3 kg)  10/03/24 170 lb (77.1 kg)  09/25/24 127 lb (57.6 kg)    Physical Exam  Physical Exam Vitals reviewed.  Constitutional:      Appearance: Normal appearance. Well-developed with normal weight.  Cardiovascular:     Rate and Rhythm: Normal rate and regular rhythm. Normal heart sounds. Normal peripheral pulses Pulmonary:     Normal breath sounds with normal effort Skin:    General: Skin is warm and dry without noticeable rash. Neurological:     General: No focal deficit present.  Psychiatric:        Mood and Affect: Mood, behavior and cognition normal      Assessment & Plan:   Encounter Diagnoses  Name Primary?   Splenomegaly Yes   Pulmonary nodule    Chronic bronchitis, unspecified chronic bronchitis type (HCC)     Orders Placed This Encounter  Procedures   Lactate dehydrogenase   Reticulocytes   Haptoglobin   Uric acid   Peripheral Blood Smear Review   CBC with Differential/Platelet   Comprehensive metabolic panel with GFR     Assessment and Plan    Splenomegaly Moderate splenomegaly at 16  cm, possibly affecting diaphragm and causing dyspnea.  - Order additional labs to evaluate cause. - LDH, Haptoglobin, peripheral smear, CBC, Rct#.  EBV, HIV, CMV serology  - Repeat CT scan in six months to monitor size. - Consider pulmonology referral if lung involvement suspected.  Solitary pulmonary nodule, right lung Solitary pulmonary nodule in right lung. PET scan negative, pulmonologist evaluation needed. - Refer to pulmonologist for further evaluation. - Repeat CT scan in six months to monitor nodule. April 2026  Chronic obstructive pulmonary disease (COPD) COPD with exertional dyspnea. Previous inhaler allergy, mucus production noted. Improvement with amoxicillin  suggests infectious component. Light smoker, attempting cessation. - Refer to pulmonologist for evaluation and management if we continue to have concerns -

## 2024-10-10 NOTE — Telephone Encounter (Signed)
 Copied from CRM [id number]. Topic: Clinical - Request for Lab/Test Order >> Oct 10, 2024  3:10 PM Shanda MATSU wrote: Reason for CRM: Patient calling in due to orders being placed for her to have 5 panels of labs done, patient stated that [facility] did 4 of those 5 panels and for the last panel, pathologist smear review, she was referred to go to the hospital to have it done, patient stated that the hospital is telling her they cannot draw the lab because there is not a CBC order with it, patient stated that CBC panel was already done at [facility], patient does not want to have another CBC done to be billed to her ins, is req a call back in regards to this.

## 2024-10-12 LAB — CBC WITH DIFFERENTIAL/PLATELET
Absolute Lymphocytes: 803 {cells}/uL — ABNORMAL LOW (ref 850–3900)
Absolute Monocytes: 407 {cells}/uL (ref 200–950)
Basophils Absolute: 50 {cells}/uL (ref 0–200)
Basophils Relative: 0.9 %
Eosinophils Absolute: 281 {cells}/uL (ref 15–500)
Eosinophils Relative: 5.1 %
HCT: 37.6 % (ref 35.0–45.0)
Hemoglobin: 12.4 g/dL (ref 11.7–15.5)
MCH: 29 pg (ref 27.0–33.0)
MCHC: 33 g/dL (ref 32.0–36.0)
MCV: 88.1 fL (ref 80.0–100.0)
MPV: 12.1 fL (ref 7.5–12.5)
Monocytes Relative: 7.4 %
Neutro Abs: 3960 {cells}/uL (ref 1500–7800)
Neutrophils Relative %: 72 %
Platelets: 107 Thousand/uL — ABNORMAL LOW (ref 140–400)
RBC: 4.27 Million/uL (ref 3.80–5.10)
RDW: 15.2 % — ABNORMAL HIGH (ref 11.0–15.0)
Total Lymphocyte: 14.6 %
WBC: 5.5 Thousand/uL (ref 3.8–10.8)

## 2024-10-12 LAB — COMPREHENSIVE METABOLIC PANEL WITH GFR
AG Ratio: 1.8 (calc) (ref 1.0–2.5)
ALT: 11 U/L (ref 6–29)
AST: 13 U/L (ref 10–35)
Albumin: 4.4 g/dL (ref 3.6–5.1)
Alkaline phosphatase (APISO): 99 U/L (ref 37–153)
BUN: 15 mg/dL (ref 7–25)
CO2: 29 mmol/L (ref 20–32)
Calcium: 9.4 mg/dL (ref 8.6–10.4)
Chloride: 102 mmol/L (ref 98–110)
Creat: 0.7 mg/dL (ref 0.50–1.05)
Globulin: 2.4 g/dL (ref 1.9–3.7)
Glucose, Bld: 88 mg/dL (ref 65–99)
Potassium: 4.7 mmol/L (ref 3.5–5.3)
Sodium: 138 mmol/L (ref 135–146)
Total Bilirubin: 0.9 mg/dL (ref 0.2–1.2)
Total Protein: 6.8 g/dL (ref 6.1–8.1)
eGFR: 98 mL/min/1.73m2 (ref >=60)

## 2024-10-12 LAB — RETICULOCYTES
ABS Retic: 72590 {cells}/uL (ref 20000–80000)
Retic Ct Pct: 1.7 %

## 2024-10-12 LAB — CMV ABS, IGG+IGM (CYTOMEGALOVIRUS)
CMV IgM: 36.9 [AU]/ml — ABNORMAL HIGH
Cytomegalovirus Ab-IgG: <0.6 U/mL

## 2024-10-12 LAB — HAPTOGLOBIN: Haptoglobin: <10 mg/dL — ABNORMAL LOW (ref 43–212)

## 2024-10-12 LAB — HIV ANTIBODY (ROUTINE TESTING W REFLEX)
HIV 1&2 Ab, 4th Generation: NONREACTIVE
HIV FINAL INTERPRETATION: NEGATIVE

## 2024-10-12 LAB — EPSTEIN-BARR VIRUS VCA ANTIBODY PANEL
EBV NA IgG: 93.2 U/mL — ABNORMAL HIGH
EBV VCA IgG: 53.2 U/mL — ABNORMAL HIGH
EBV VCA IgM: 152 U/mL — ABNORMAL HIGH

## 2024-10-12 LAB — URIC ACID: Uric Acid, Serum: 2.9 mg/dL (ref 2.5–7.0)

## 2024-10-12 LAB — LACTATE DEHYDROGENASE: LDH: 169 U/L (ref 120–250)

## 2024-10-17 ENCOUNTER — Ambulatory Visit

## 2024-10-17 VITALS — BP 154/97 | HR 107 | Ht 67.0 in | Wt 174.0 lb

## 2024-10-17 DIAGNOSIS — R0600 Dyspnea, unspecified: Secondary | ICD-10-CM

## 2024-10-17 DIAGNOSIS — R161 Splenomegaly, not elsewhere classified: Secondary | ICD-10-CM

## 2024-10-17 NOTE — Progress Notes (Signed)
 Progress Note  Physician: Rossanna Spitzley A Izola Teague, MD   HPI: Rebecca Lara is a 63 y.o. female presenting on 10/17/2024 for Follow-up (Patient couldn't get test done.) .  Discussed the use of AI scribe software for clinical note transcription with the patient, who gave verbal consent to proceed.  History of Present Illness   Rebecca Lara is a 63 year old female who presents with follow-up of lab results and ongoing symptoms related to an enlarged spleen.  Splenomegaly and associated symptoms - Enlarged spleen at 16 cm present for the past 3-4 months, persistent over several months - Spleen enlargement causes discomfort and impacts daily activities - Sensation of pressure from spleen, especially during bowel movements - Shortness of breath, particularly with exertion such as climbing stairs or carrying items, attributed to spleen pressing on lungs - No fever - Breathing has improved compared to initial presentation   Hematologic abnormalities and bleeding manifestations - Petechiae present on arms, associated with low platelet count and splenomegaly - Easy bruising  - Recent laboratory results show thrombocytopenia with platelets at 107 - Positive Epstein-Barr virus and CMV viral titers  Laboratory and diagnostic testing issues - Attempted peripheral blood smear was unsuccessful due to missing lab order, resulting in over an hour spent at the hospital without completion of the test         Medical history:  Relevant past medical, surgical, family and social history reviewed and updated as indicated. Interim medical history since our last visit reviewed.  Allergies and medications reviewed and updated.   ROS: Negative unless specifically indicated above in HPI.    Current Outpatient Medications:    albuterol  (VENTOLIN  HFA) 108 (90 Base) MCG/ACT inhaler, Inhale 1-2 puffs into the lungs every 4 (four) hours as needed for wheezing or shortness of breath., Disp: 1  each, Rfl: 8   budesonide-glycopyrrolate-formoterol  (BREZTRI) 160-9-4.8 MCG/ACT AERO inhaler, Inhale 2 puffs into the lungs 2 (two) times daily., Disp: 1 each, Rfl: 6   cetirizine  (ZYRTEC  ALLERGY) 10 MG tablet, Take 1 tablet (10 mg total) by mouth daily., Disp: 30 tablet, Rfl: 0   EPINEPHrine (PRIMATENE MIST IN), Inhale into the lungs as needed., Disp: , Rfl:    fluticasone  (FLONASE ) 50 MCG/ACT nasal spray, Place 1 spray into both nostrils daily., Disp: 15.8 mL, Rfl: 0   Multiple Vitamins-Minerals (MULTIVITAMIN ADULT PO), Take 1 tablet by mouth daily., Disp: , Rfl:    rosuvastatin (CRESTOR) 20 MG tablet, Take 1 tablet (20 mg total) by mouth daily., Disp: 90 tablet, Rfl: 1   Spacer/Aero-Holding Chambers (OPTICHAMBER DIAMOND) MISC, as directed., Disp: , Rfl:        Objective:     BP (!) 154/97   Pulse (!) 107   Ht 5' 7 (1.702 m)   Wt 174 lb (78.9 kg)   SpO2 97%   BMI 27.25 kg/m   Wt Readings from Last 3 Encounters:  10/17/24 174 lb (78.9 kg)  10/10/24 172 lb 9.6 oz (78.3 kg)  10/03/24 170 lb (77.1 kg)    Physical Exam  Physical Exam Vitals reviewed.  Constitutional:      Appearance: Normal appearance. Well-developed with normal weight.  Cardiovascular:     Rate and Rhythm: Normal rate and regular rhythm. Normal heart sounds. Normal peripheral pulses Pulmonary:     Normal breath sounds with normal effort Skin:    General: Skin is warm and dry without noticeable rash. Neurological:  General: No focal deficit present.  Psychiatric:        Mood and Affect: Mood, behavior and cognition normal      Assessment & Plan:   Encounter Diagnoses  Name Primary?   Dyspnea, unspecified type Yes   Splenomegaly     No orders of the defined types were placed in this encounter.    Assessment and Plan    Splenomegaly with thrombocytopenia and hemolysis    Platelet count is low at 107, and haptoglobin levels indicate splenic hemolysis. Query cold agglutinin effect. Prolonged  splenomegaly requires further investigation into a lymphoproliferative process. . Order a peripheral blood smear - Refer to hematology based on smear results.  Suspected viral infection (EBV/CMV)   Positive EBV and CMV titers with atypical clinical presentation. Prolonged splenomegaly suggests a possible cross-reactive process rather than active infection.  Could do additional testing as well   Dyspnea   Intermittent dyspnea is likely related to splenomegaly affecting the diaphragm.  Will have the office manager sort out the lack of ability to obtain smear and will f/u with the patient after the result in.

## 2024-10-19 ENCOUNTER — Other Ambulatory Visit: Payer: Self-pay

## 2024-10-19 ENCOUNTER — Other Ambulatory Visit: Admission: RE | Admit: 2024-10-19 | Discharge: 2024-10-19 | Disposition: A | Discharge status: Home / Self Care

## 2024-10-19 DIAGNOSIS — R161 Splenomegaly, not elsewhere classified: Secondary | ICD-10-CM | POA: Insufficient documentation

## 2024-10-19 LAB — CBC WITH DIFFERENTIAL/PLATELET
Abs Immature Granulocytes: 0.02 K/uL (ref 0.00–0.07)
Basophils Absolute: 0 K/uL (ref 0.0–0.1)
Basophils Relative: 1 %
Eosinophils Absolute: 0.2 K/uL (ref 0.0–0.5)
Eosinophils Relative: 4 %
HCT: 35.7 % — ABNORMAL LOW (ref 36.0–46.0)
Hemoglobin: 12.5 g/dL (ref 12.0–15.0)
Immature Granulocytes: 0 %
Lymphocytes Relative: 13 %
Lymphs Abs: 0.8 K/uL (ref 0.7–4.0)
MCH: 31.5 pg (ref 26.0–34.0)
MCHC: 35 g/dL (ref 30.0–36.0)
MCV: 89.9 fL (ref 80.0–100.0)
Monocytes Absolute: 0.4 K/uL (ref 0.1–1.0)
Monocytes Relative: 6 %
Neutro Abs: 4.4 K/uL (ref 1.7–7.7)
Neutrophils Relative %: 76 %
Platelets: 89 K/uL — ABNORMAL LOW (ref 150–400)
RBC: 3.97 MIL/uL (ref 3.87–5.11)
RDW: 17.2 % — ABNORMAL HIGH (ref 11.5–15.5)
Smear Review: NORMAL
WBC: 5.8 K/uL (ref 4.0–10.5)
nRBC: 0 % (ref 0.0–0.2)

## 2024-10-19 LAB — COMPREHENSIVE METABOLIC PANEL WITH GFR
ALT: 13 U/L (ref 0–44)
AST: 17 U/L (ref 15–41)
Albumin: 4.1 g/dL (ref 3.5–5.0)
Alkaline Phosphatase: 83 U/L (ref 38–126)
Anion gap: 11 (ref 5–15)
BUN: 17 mg/dL (ref 8–23)
CO2: 26 mmol/L (ref 22–32)
Calcium: 8.9 mg/dL (ref 8.9–10.3)
Chloride: 102 mmol/L (ref 98–111)
Creatinine, Ser: 0.67 mg/dL (ref 0.44–1.00)
GFR, Estimated: >60 mL/min (ref >60)
Glucose, Bld: 112 mg/dL — ABNORMAL HIGH (ref 70–99)
Potassium: 4.5 mmol/L (ref 3.5–5.1)
Sodium: 139 mmol/L (ref 135–145)
Total Bilirubin: 1.2 mg/dL (ref 0.0–1.2)
Total Protein: 7.4 g/dL (ref 6.5–8.1)

## 2024-10-19 NOTE — Addendum Note (Signed)
 Addended by: Keven Osborn M on: 10/19/2024 02:00 PM   Modules accepted: Orders

## 2024-10-22 ENCOUNTER — Other Ambulatory Visit: Payer: Self-pay

## 2024-10-24 ENCOUNTER — Ambulatory Visit

## 2024-10-24 VITALS — BP 140/90 | HR 116 | Ht 67.0 in | Wt 174.0 lb

## 2024-10-24 DIAGNOSIS — R161 Splenomegaly, not elsewhere classified: Secondary | ICD-10-CM | POA: Diagnosis not present

## 2024-10-24 NOTE — Progress Notes (Signed)
 Progress Note  Physician: Shiela Bruns A Mallika Sanmiguel, MD   Patient contacted 10/24/24 at  2:40 PM EST by a video enabled telemedicine application and verified that I am speaking with the correct person.   Patient is aware of limitations of evaluation by telemedicine The patient expressed understanding and agreed to proceed.   HPI: Rebecca Lara is a 63 y.o. female presenting on 10/24/2024 for Follow-up . Discussed the use of AI scribe software for clinical note transcription with the patient, who gave verbal consent to proceed.  History of Present Illness   Rebecca Lara is a 63 year old female who presents with concerns about splenomegaly and persistent symptoms.  Splenomegaly and hematologic abnormalities - Splenomegaly present for approximately four months, since July -11/17 Platelet count of 89 - Recent laboratory tests show normal cell morphology - Family history of leukemia (father) - Positive serology for CMV and Epstein-Barr virus  Dyspnea and respiratory symptoms - Persistent shortness of breath for four months - a combination of COPD with splenomegaly interfering with diaphragm - Breathing has improved slightly but remains difficult, especially with exertion such as climbing stairs - Use of inhaler has provided some improvement in breathing - Minimal tobacco use, limited to two or three puffs at a time due to breathing difficulties  Social history:  Relevant past medical, surgical, family and social history reviewed and updated as indicated. Interim medical history since our last visit reviewed.  Allergies and medications reviewed and updated.  DATA REVIEWED: CHART IN EPIC  ROS: Negative unless specifically indicated above in HPI.    Current Outpatient Medications:    albuterol  (VENTOLIN  HFA) 108 (90 Base) MCG/ACT inhaler, Inhale 1-2 puffs into the lungs every 4 (four) hours as needed for wheezing or shortness of breath., Disp: 1 each, Rfl: 8    budesonide-glycopyrrolate-formoterol  (BREZTRI) 160-9-4.8 MCG/ACT AERO inhaler, Inhale 2 puffs into the lungs 2 (two) times daily., Disp: 1 each, Rfl: 6   cetirizine  (ZYRTEC  ALLERGY) 10 MG tablet, Take 1 tablet (10 mg total) by mouth daily., Disp: 30 tablet, Rfl: 0   EPINEPHrine (PRIMATENE MIST IN), Inhale into the lungs as needed., Disp: , Rfl:    fluticasone  (FLONASE ) 50 MCG/ACT nasal spray, Place 1 spray into both nostrils daily., Disp: 15.8 mL, Rfl: 0   Multiple Vitamins-Minerals (MULTIVITAMIN ADULT PO), Take 1 tablet by mouth daily., Disp: , Rfl:    rosuvastatin (CRESTOR) 20 MG tablet, Take 1 tablet (20 mg total) by mouth daily., Disp: 90 tablet, Rfl: 1   Spacer/Aero-Holding Chambers (OPTICHAMBER DIAMOND) MISC, as directed., Disp: , Rfl:       Objective:  Telephone visit     Assessment & Plan:  Splenomegaly -     Ambulatory referral to Hematology / Oncology   Assessment and Plan    Splenomegaly with thrombocytopenia   Chronic splenomegaly with thrombocytopenia is present, with normal cell morphology and bone marrow counts. The differential diagnosis includes post-viral splenomegaly, possibly linked to a previous Epstein-Barr virus infection, but splenomegaly prolonged and thrombocytopenia persists. Cross reactivity with other pathologic processes possible. A family history of hematologic malignancy is also considered. Referral to hematology for further evaluation.   Shortness of breath   She experiences intermittent shortness of breath, possibly related primarily to splenomegaly.  COPD component improved with inhaler use. She should continue her current inhaler regimen.   Tobacco use   She continues to use tobacco and has difficulty breathing deeply. Smoking cessation is  advised     Thrombocytopenia - Plan to recheck labs in the next 3-4 weeks   No follow-ups on file.

## 2024-11-05 ENCOUNTER — Other Ambulatory Visit: Payer: Self-pay

## 2024-11-06 ENCOUNTER — Other Ambulatory Visit: Payer: Self-pay

## 2024-11-06 NOTE — Telephone Encounter (Unsigned)
 Copied from CRM [id number]. Topic: Clinical - Medication Refill >> Nov 06, 2024  9:35 AM Sophia H wrote: Medication: fluticasone  (FLONASE ) 50 MCG/ACT nasal spray   Has the patient contacted their pharmacy? Yes, no fills on file per pharmacy.   This is the patient's preferred pharmacy:  [id number] - [place], [place] - [address] [place] [address] Phone: [phone] Fax: [phone]  Is this the correct pharmacy for this prescription? Yes If no, delete pharmacy and type the correct one.   Has the prescription been filled recently? Yes  Is the patient out of the medication? Yes  Has the patient been seen for an appointment in the last year OR does the patient have an upcoming appointment? Yes, seen 11/12  Can we respond through MyChart? Yes  Agent: Please be advised that Rx refills may take up to 3 business days. We ask that you follow-up with your pharmacy.

## 2024-11-07 MED ORDER — FLUTICASONE PROPIONATE 50 MCG/ACT NA SUSP: 1.0000 | Freq: Every day | NASAL | 0 refills | Status: AC

## 2024-11-07 NOTE — Telephone Encounter (Signed)
 Requested Prescriptions  Pending Prescriptions Disp Refills   fluticasone  (FLONASE ) 50 MCG/ACT nasal spray 15.8 mL 0    Sig: Place 1 spray into both nostrils daily.     Ear, Nose, and Throat: Nasal Preparations - Corticosteroids Passed - 11/07/2024  2:05 PM      Passed - Valid encounter within last 12 months    Recent Outpatient Visits           2 weeks ago Splenomegaly   [place] [facility] [place], [place] A, MD   3 weeks ago Dyspnea, unspecified type   [place] [facility] Everlene Parris LABOR, MD   4 weeks ago Splenomegaly   [place] [facility] Everlene Parris LABOR, MD   1 month ago Splenomegaly   [place] [facility] [place] A, MD   1 month ago Chronic bronchitis, unspecified chronic bronchitis type [facility])   [place] [facility] Everlene Parris LABOR, MD

## 2024-11-13 ENCOUNTER — Inpatient Hospital Stay: Attending: Oncology | Admitting: Oncology

## 2024-11-13 ENCOUNTER — Encounter: Payer: Self-pay | Admitting: Oncology

## 2024-11-13 ENCOUNTER — Telehealth: Payer: Self-pay

## 2024-11-13 ENCOUNTER — Inpatient Hospital Stay

## 2024-11-13 VITALS — BP 145/76 | HR 106 | Temp 98.4°F | Resp 19 | Ht 67.0 in | Wt 172.8 lb

## 2024-11-13 DIAGNOSIS — I10 Essential (primary) hypertension: Secondary | ICD-10-CM | POA: Insufficient documentation

## 2024-11-13 DIAGNOSIS — R040 Epistaxis: Secondary | ICD-10-CM | POA: Insufficient documentation

## 2024-11-13 DIAGNOSIS — D696 Thrombocytopenia, unspecified: Secondary | ICD-10-CM | POA: Insufficient documentation

## 2024-11-13 DIAGNOSIS — R911 Solitary pulmonary nodule: Secondary | ICD-10-CM | POA: Insufficient documentation

## 2024-11-13 DIAGNOSIS — R161 Splenomegaly, not elsewhere classified: Secondary | ICD-10-CM | POA: Insufficient documentation

## 2024-11-13 DIAGNOSIS — E785 Hyperlipidemia, unspecified: Secondary | ICD-10-CM | POA: Insufficient documentation

## 2024-11-13 NOTE — Telephone Encounter (Signed)
 Copied from CRM [id number]. Topic: General - Other >> Nov 13, 2024  2:24 PM Wess RAMAN wrote: Reason for CRM: Miranda from [facility] would like to speak with someone in the office about a prior authorization for pet scan for patient.  Callback #: [phone] or [phone] opt [phone] Fax #: [phone] Reference #: [id number]

## 2024-11-13 NOTE — Progress Notes (Signed)
 Hematology/Oncology Consult note [facility] Telephone:([phone] [phone]  Patient Care Team: Zafirov, Clarissa A, MD as PCP - General (Family Medicine)   Name of the patient: Rebecca Lara  [id number]  [birth year]    Reason for referral-splenomegaly   Referring physician-Dr. Everlene  Date of visit: 11/13/24   History of presenting illness- Patient is a 63 year old female with a past medical history significant for hypertension hyperlipidemia who has been referred for splenomegaly.  Patient had a CT chest without contrast on 09/19/2024 which showed a spiculated right upper lobe lung nodule measuring 0.8 x 0.5 cm.  Partially imaged severe splenomegaly.  This was followed by ultrasound abdomen which showed normal echogenicity in the liver.  Spleen was enlarged at 16.3 x 7.9 x 7.9 cm with a volume of 1216 cc.  This was followed by a PET scan on 10/03/2024 which showed right upper lobe 7 mm lung nodule below the PET resolution.  There was no thoracic nodal hypermetabolism or intra-abdominal adenopathy.  Spleen measuring 15.5 x 9.5 x 14.1 cm with a volume of 1079 cc.  There was no focal hypermetabolism noted in the spleen no abnormal FDG activity in the bones.  She experiences discomfort from the enlarged spleen, particularly when bending down. She has also experienced episodes of epistaxis for months, occurring every couple of weeks, especially when exerting pressure. She describes the discomfort as 'painful' when pressure is applied during an ultrasound. She has had epistaxis for months, occurring every couple of weeks, especially when exerting pressure.  She is frustrated with the financial burden of medical tests, having incurred significant out-of-pocket expenses, including a $10,000 PET scan and $2,000 in coinsurance balances. She is concerned about the cost of further testing and the lack of definitive results.  No alcohol consumption. No symptoms  suggestive of cirrhosis or heart failure.      ECOG PS- 0  Pain scale- 3   Review of systems- Review of Systems  Constitutional:  Positive for malaise/fatigue. Negative for chills, fever and weight loss.  HENT:  Positive for nosebleeds. Negative for congestion and ear discharge.   Eyes:  Negative for blurred vision.  Respiratory:  Negative for cough, hemoptysis, sputum production, shortness of breath and wheezing.   Cardiovascular:  Negative for chest pain, palpitations, orthopnea and claudication.  Gastrointestinal:  Negative for abdominal pain, blood in stool, constipation, diarrhea, heartburn, melena, nausea and vomiting.  Genitourinary:  Negative for dysuria, flank pain, frequency, hematuria and urgency.  Musculoskeletal:  Negative for back pain, joint pain and myalgias.  Skin:  Negative for rash.  Neurological:  Negative for dizziness, tingling, focal weakness, seizures, weakness and headaches.  Endo/Heme/Allergies:  Does not bruise/bleed easily.  Psychiatric/Behavioral:  Negative for depression and suicidal ideas. The patient does not have insomnia.     Allergies  Allergen Reactions   Biaxin [Clarithromycin] Nausea And Vomiting    Patient Active Problem List   Diagnosis Date Noted   Splenomegaly 09/25/2024   CAD (coronary artery disease) 09/25/2024   Pulmonary nodule 09/25/2024   Dyspnea 09/11/2024   COPD (chronic obstructive pulmonary disease) (HCC) 08/23/2024   Thrombocytopenia 08/23/2024   Hypertension 08/23/2024     Past Medical History:  Diagnosis Date   Hypertension      Past Surgical History:  Procedure Laterality Date   ABDOMINAL HYSTERECTOMY     BACK SURGERY     NASAL SEPTUM SURGERY      Social History   Socioeconomic History   Marital status: Married  Spouse name: Not on file   Number of children: Not on file   Years of education: Not on file   Highest education level: Not on file  Occupational History   Not on file  Tobacco Use    Smoking status: Every Day    Current packs/day: 0.50    Types: Cigarettes   Smokeless tobacco: Never  Vaping Use   Vaping status: Never Used  Substance and Sexual Activity   Alcohol use: No    Alcohol/week: 0.0 standard drinks of alcohol   Drug use: No   Sexual activity: Yes    Birth control/protection: Surgical  Other Topics Concern   Not on file  Social History Narrative   Not on file   Social Drivers of Health   Financial Resource Strain: Not on file  Food Insecurity: Not on file  Transportation Needs: Not on file  Physical Activity: Not on file  Stress: Not on file  Social Connections: Not on file  Intimate Partner Violence: Not on file     No family history on file.   Current Outpatient Medications:    albuterol  (VENTOLIN  HFA) 108 (90 Base) MCG/ACT inhaler, Inhale 1-2 puffs into the lungs every 4 (four) hours as needed for wheezing or shortness of breath., Disp: 1 each, Rfl: 8   budesonide-glycopyrrolate-formoterol  (BREZTRI) 160-9-4.8 MCG/ACT AERO inhaler, Inhale 2 puffs into the lungs 2 (two) times daily., Disp: 1 each, Rfl: 6   cetirizine  (ZYRTEC  ALLERGY) 10 MG tablet, Take 1 tablet (10 mg total) by mouth daily., Disp: 30 tablet, Rfl: 0   Multiple Vitamins-Minerals (MULTIVITAMIN ADULT PO), Take 1 tablet by mouth daily., Disp: , Rfl:    rosuvastatin  (CRESTOR ) 20 MG tablet, Take 1 tablet (20 mg total) by mouth daily., Disp: 90 tablet, Rfl: 1   Spacer/Aero-Holding Chambers [facility] DIAMOND) MISC, as directed., Disp: , Rfl:    EPINEPHrine (PRIMATENE MIST IN), Inhale into the lungs as needed. (Patient not taking: Reported on 11/13/2024), Disp: , Rfl:    fluticasone  (FLONASE ) 50 MCG/ACT nasal spray, Place 1 spray into both nostrils daily. (Patient not taking: Reported on 11/13/2024), Disp: 15.8 mL, Rfl: 0   Physical exam: There were no vitals filed for this visit. Physical Exam Cardiovascular:     Rate and Rhythm: Normal rate and regular rhythm.     Heart sounds:  Normal heart sounds.  Pulmonary:     Effort: Pulmonary effort is normal.     Breath sounds: Normal breath sounds.  Abdominal:     General: Bowel sounds are normal.     Palpations: Abdomen is soft.     Comments: No palpable splenomegaly  Lymphadenopathy:     Comments: No palpable cervical, supraclavicular, axillary or inguinal adenopathy    Skin:    General: Skin is warm and dry.  Neurological:     Mental Status: She is alert and oriented to person, place, and time.           Latest Ref Rng & Units 10/19/2024    2:07 PM  CMP  Glucose 70 - 99 mg/dL 887   BUN 8 - 23 mg/dL 17   Creatinine 9.55 - 1.00 mg/dL 9.32   Sodium 864 - 854 mmol/L 139   Potassium 3.5 - 5.1 mmol/L 4.5   Chloride 98 - 111 mmol/L 102   CO2 22 - 32 mmol/L 26   Calcium  8.9 - 10.3 mg/dL 8.9   Total Protein 6.5 - 8.1 g/dL 7.4   Total Bilirubin 0.0 - 1.2 mg/dL  1.2   Alkaline Phos 38 - 126 U/L 83   AST 15 - 41 U/L 17   ALT 0 - 44 U/L 13       Latest Ref Rng & Units 10/19/2024    2:07 PM  CBC  WBC 4.0 - 10.5 K/uL 5.8   Hemoglobin 12.0 - 15.0 g/dL 87.4   Hematocrit 63.9 - 46.0 % 35.7   Platelets 150 - 400 K/uL 89      Assessment and plan- Patient is a 63 y.o. female referred for splenomegaly  Splenomegaly can be seen in conditions such as congestive conditions such as cirrhosis or heart failure.  Clinically patient does not have any evidence of these conditions.  Liver appeared normal on ultrasound and PET scan not suggestive of cirrhosis  Splenomegaly can be seen in hematological malignancies such as multiple myeloma, lymphoma or myeloproliferative disorders.  No evidence of lymphoma on PET scan.  CBC shows mild thrombocytopenia with a platelet count between 80s to 100s with a normal white count and hemoglobin not overtly suggestive of a myeloproliferative disorder.  Splenomegaly can be seen with infectious conditions and she did have elevated IgM EBV titers which can explain the splenomegaly.  No overt  evidence of autoimmune diseases based on history.  Also PET scan did not show any evidence of focal uptake in the spleen.  As such it is not possible to do splenic biopsy either.  At this time I am inclined to monitor her splenomegaly conservatively without the need for a bone marrow biopsy and I will follow it up with a repeat ultrasound abdomen in 4 months time.  If there is persistent splenomegaly or worsening findings noted I will consider a bone marrow biopsy at that time  Patient was noted to have undetectable haptoglobin but given that she had a normal LDH and no evidence of hyperbilirubinemia I am not concerned about overt hemolysis at this time  With regards to thrombocytopenia: Unlikely to be a primary bone marrow disorder since her white cell count and hemoglobin is normal.  There may be a component of ITP.  Will repeat CBC B12 and folate and April as well   Thank you for this kind referral and the opportunity to participate in the care of this patient   Visit Diagnosis 1. Splenomegaly     Dr. Annah Skene, MD, MPH [facility] [phone] 11/13/2024              '

## 2024-11-13 NOTE — Progress Notes (Signed)
 New patient; Referral for Splenomegaly.

## 2024-11-15 ENCOUNTER — Telehealth: Payer: Self-pay

## 2024-11-15 NOTE — Telephone Encounter (Signed)
 Copied from CRM [id number]. Topic: Referral - Status >> Nov 15, 2024  8:06 AM Charlet HERO wrote: Reason for CRM: Rebecca Lara is calling to leave a message for Dr JENEANE she is calling about a prior auth for the patient Dr order pro pat imaging and ct, she needs to notify that the medical director was not able to approve the request. She is thinking it may be duplicate She is stating that the there was 2 cases and the first was accepted but the second was denied will fax over to the office.

## 2024-11-15 NOTE — Telephone Encounter (Signed)
 Copied from CRM [id number]. Topic: Referral - Question >> Nov 15, 2024  1:21 PM Antony RAMAN wrote: Reason for CRM: Jackolyn Mayer called to let us  know, giving verbal notice the medical director not able to approve the pet imaging with concurrently aquired ct (skull base to mid thigh)  There is an Approval on file  Callback, [phone]  case number [phone]

## 2025-02-26 ENCOUNTER — Inpatient Hospital Stay: Admitting: Oncology

## 2025-02-26 ENCOUNTER — Inpatient Hospital Stay

## 2025-04-08 ENCOUNTER — Inpatient Hospital Stay

## 2025-04-08 ENCOUNTER — Inpatient Hospital Stay: Admitting: Oncology
